# Patient Record
Sex: Male | Born: 1945
Health system: Southern US, Community
[De-identification: ages and names within clinical notes are randomized; demographics above are authoritative.]

## PROBLEM LIST (undated history)

## (undated) DIAGNOSIS — I2510 Atherosclerotic heart disease of native coronary artery without angina pectoris: Secondary | ICD-10-CM

## (undated) DIAGNOSIS — Z940 Kidney transplant status: Secondary | ICD-10-CM

## (undated) DIAGNOSIS — Z9489 Other transplanted organ and tissue status: Secondary | ICD-10-CM

## (undated) DIAGNOSIS — G629 Polyneuropathy, unspecified: Secondary | ICD-10-CM

## (undated) DIAGNOSIS — I456 Pre-excitation syndrome: Secondary | ICD-10-CM

## (undated) HISTORY — PX: CARDIAC CATHETERIZATION: SHX172

## (undated) HISTORY — PX: EYE SURGERY: SHX253

## (undated) HISTORY — PX: KIDNEY TRANSPLANT: SHX239

## (undated) HISTORY — PX: COMBINED KIDNEY-PANCREAS TRANSPLANT: SHX1382

---

## 2016-03-03 DIAGNOSIS — Z885 Allergy status to narcotic agent status: Secondary | ICD-10-CM | POA: Diagnosis not present

## 2016-03-03 DIAGNOSIS — E119 Type 2 diabetes mellitus without complications: Secondary | ICD-10-CM | POA: Diagnosis not present

## 2016-03-03 DIAGNOSIS — R0602 Shortness of breath: Secondary | ICD-10-CM | POA: Diagnosis not present

## 2016-03-03 DIAGNOSIS — Z87891 Personal history of nicotine dependence: Secondary | ICD-10-CM | POA: Diagnosis not present

## 2016-03-03 DIAGNOSIS — R202 Paresthesia of skin: Secondary | ICD-10-CM | POA: Diagnosis not present

## 2016-03-03 DIAGNOSIS — I2510 Atherosclerotic heart disease of native coronary artery without angina pectoris: Secondary | ICD-10-CM | POA: Diagnosis not present

## 2016-03-03 DIAGNOSIS — I456 Pre-excitation syndrome: Secondary | ICD-10-CM | POA: Diagnosis not present

## 2016-03-03 DIAGNOSIS — Z79899 Other long term (current) drug therapy: Secondary | ICD-10-CM | POA: Diagnosis not present

## 2016-03-03 DIAGNOSIS — Z791 Long term (current) use of non-steroidal anti-inflammatories (NSAID): Secondary | ICD-10-CM | POA: Diagnosis not present

## 2016-03-03 DIAGNOSIS — Z7982 Long term (current) use of aspirin: Secondary | ICD-10-CM | POA: Diagnosis not present

## 2016-03-03 DIAGNOSIS — R0789 Other chest pain: Secondary | ICD-10-CM | POA: Diagnosis not present

## 2016-03-03 DIAGNOSIS — R079 Chest pain, unspecified: Secondary | ICD-10-CM | POA: Diagnosis not present

## 2016-03-03 DIAGNOSIS — Z8673 Personal history of transient ischemic attack (TIA), and cerebral infarction without residual deficits: Secondary | ICD-10-CM | POA: Diagnosis not present

## 2016-03-03 DIAGNOSIS — Z91018 Allergy to other foods: Secondary | ICD-10-CM | POA: Diagnosis not present

## 2016-03-03 DIAGNOSIS — I10 Essential (primary) hypertension: Secondary | ICD-10-CM | POA: Diagnosis not present

## 2016-04-06 DIAGNOSIS — Z91018 Allergy to other foods: Secondary | ICD-10-CM | POA: Diagnosis not present

## 2016-04-06 DIAGNOSIS — R9431 Abnormal electrocardiogram [ECG] [EKG]: Secondary | ICD-10-CM | POA: Diagnosis not present

## 2016-04-06 DIAGNOSIS — R42 Dizziness and giddiness: Secondary | ICD-10-CM | POA: Diagnosis not present

## 2016-04-06 DIAGNOSIS — Z791 Long term (current) use of non-steroidal anti-inflammatories (NSAID): Secondary | ICD-10-CM | POA: Diagnosis not present

## 2016-04-06 DIAGNOSIS — Z87891 Personal history of nicotine dependence: Secondary | ICD-10-CM | POA: Diagnosis not present

## 2016-04-06 DIAGNOSIS — R001 Bradycardia, unspecified: Secondary | ICD-10-CM | POA: Diagnosis not present

## 2016-04-06 DIAGNOSIS — Z885 Allergy status to narcotic agent status: Secondary | ICD-10-CM | POA: Diagnosis not present

## 2016-04-06 DIAGNOSIS — Z8673 Personal history of transient ischemic attack (TIA), and cerebral infarction without residual deficits: Secondary | ICD-10-CM | POA: Diagnosis not present

## 2016-04-06 DIAGNOSIS — Z79899 Other long term (current) drug therapy: Secondary | ICD-10-CM | POA: Diagnosis not present

## 2016-04-06 DIAGNOSIS — R079 Chest pain, unspecified: Secondary | ICD-10-CM | POA: Diagnosis not present

## 2016-04-06 DIAGNOSIS — R531 Weakness: Secondary | ICD-10-CM | POA: Diagnosis not present

## 2016-04-06 DIAGNOSIS — I10 Essential (primary) hypertension: Secondary | ICD-10-CM | POA: Diagnosis not present

## 2016-05-03 DIAGNOSIS — I639 Cerebral infarction, unspecified: Secondary | ICD-10-CM | POA: Diagnosis not present

## 2016-05-03 DIAGNOSIS — I2510 Atherosclerotic heart disease of native coronary artery without angina pectoris: Secondary | ICD-10-CM | POA: Diagnosis not present

## 2016-05-03 DIAGNOSIS — E119 Type 2 diabetes mellitus without complications: Secondary | ICD-10-CM | POA: Diagnosis not present

## 2016-05-03 DIAGNOSIS — R51 Headache: Secondary | ICD-10-CM | POA: Diagnosis not present

## 2016-05-03 DIAGNOSIS — R9431 Abnormal electrocardiogram [ECG] [EKG]: Secondary | ICD-10-CM | POA: Diagnosis not present

## 2016-05-03 DIAGNOSIS — I10 Essential (primary) hypertension: Secondary | ICD-10-CM | POA: Diagnosis not present

## 2016-05-03 DIAGNOSIS — R202 Paresthesia of skin: Secondary | ICD-10-CM | POA: Diagnosis not present

## 2016-05-03 DIAGNOSIS — M25512 Pain in left shoulder: Secondary | ICD-10-CM | POA: Diagnosis not present

## 2016-05-03 DIAGNOSIS — E11618 Type 2 diabetes mellitus with other diabetic arthropathy: Secondary | ICD-10-CM | POA: Diagnosis not present

## 2016-05-03 DIAGNOSIS — I471 Supraventricular tachycardia: Secondary | ICD-10-CM | POA: Diagnosis not present

## 2016-05-03 DIAGNOSIS — R479 Unspecified speech disturbances: Secondary | ICD-10-CM | POA: Diagnosis not present

## 2016-05-03 DIAGNOSIS — R079 Chest pain, unspecified: Secondary | ICD-10-CM | POA: Diagnosis not present

## 2016-05-03 DIAGNOSIS — Z87891 Personal history of nicotine dependence: Secondary | ICD-10-CM | POA: Diagnosis not present

## 2016-05-03 DIAGNOSIS — Z91018 Allergy to other foods: Secondary | ICD-10-CM | POA: Diagnosis not present

## 2016-05-03 DIAGNOSIS — Z951 Presence of aortocoronary bypass graft: Secondary | ICD-10-CM | POA: Diagnosis not present

## 2016-05-15 DIAGNOSIS — M545 Low back pain: Secondary | ICD-10-CM | POA: Diagnosis not present

## 2016-05-15 DIAGNOSIS — R262 Difficulty in walking, not elsewhere classified: Secondary | ICD-10-CM | POA: Diagnosis not present

## 2016-05-15 DIAGNOSIS — M6281 Muscle weakness (generalized): Secondary | ICD-10-CM | POA: Diagnosis not present

## 2016-05-17 DIAGNOSIS — M6281 Muscle weakness (generalized): Secondary | ICD-10-CM | POA: Diagnosis not present

## 2016-05-17 DIAGNOSIS — M545 Low back pain: Secondary | ICD-10-CM | POA: Diagnosis not present

## 2016-05-17 DIAGNOSIS — R262 Difficulty in walking, not elsewhere classified: Secondary | ICD-10-CM | POA: Diagnosis not present

## 2016-05-23 DIAGNOSIS — M545 Low back pain: Secondary | ICD-10-CM | POA: Diagnosis not present

## 2016-05-23 DIAGNOSIS — R262 Difficulty in walking, not elsewhere classified: Secondary | ICD-10-CM | POA: Diagnosis not present

## 2016-05-23 DIAGNOSIS — M6281 Muscle weakness (generalized): Secondary | ICD-10-CM | POA: Diagnosis not present

## 2016-05-29 DIAGNOSIS — Z9483 Pancreas transplant status: Secondary | ICD-10-CM | POA: Diagnosis not present

## 2016-05-29 DIAGNOSIS — I456 Pre-excitation syndrome: Secondary | ICD-10-CM | POA: Diagnosis not present

## 2016-05-29 DIAGNOSIS — E876 Hypokalemia: Secondary | ICD-10-CM | POA: Diagnosis not present

## 2016-05-29 DIAGNOSIS — I2510 Atherosclerotic heart disease of native coronary artery without angina pectoris: Secondary | ICD-10-CM | POA: Diagnosis not present

## 2016-05-29 DIAGNOSIS — I4891 Unspecified atrial fibrillation: Secondary | ICD-10-CM | POA: Diagnosis not present

## 2016-05-29 DIAGNOSIS — E119 Type 2 diabetes mellitus without complications: Secondary | ICD-10-CM | POA: Diagnosis not present

## 2016-05-29 DIAGNOSIS — Z940 Kidney transplant status: Secondary | ICD-10-CM | POA: Diagnosis not present

## 2016-05-29 DIAGNOSIS — Z7901 Long term (current) use of anticoagulants: Secondary | ICD-10-CM | POA: Diagnosis not present

## 2016-05-29 DIAGNOSIS — K219 Gastro-esophageal reflux disease without esophagitis: Secondary | ICD-10-CM | POA: Diagnosis not present

## 2016-05-29 DIAGNOSIS — G43109 Migraine with aura, not intractable, without status migrainosus: Secondary | ICD-10-CM | POA: Diagnosis not present

## 2016-05-29 DIAGNOSIS — E785 Hyperlipidemia, unspecified: Secondary | ICD-10-CM | POA: Diagnosis not present

## 2016-05-29 DIAGNOSIS — R0600 Dyspnea, unspecified: Secondary | ICD-10-CM | POA: Diagnosis not present

## 2016-05-29 DIAGNOSIS — Z79899 Other long term (current) drug therapy: Secondary | ICD-10-CM | POA: Diagnosis not present

## 2016-05-30 DIAGNOSIS — I4891 Unspecified atrial fibrillation: Secondary | ICD-10-CM | POA: Diagnosis not present

## 2016-05-30 DIAGNOSIS — R079 Chest pain, unspecified: Secondary | ICD-10-CM | POA: Diagnosis not present

## 2016-05-30 DIAGNOSIS — D72829 Elevated white blood cell count, unspecified: Secondary | ICD-10-CM | POA: Diagnosis not present

## 2016-05-31 DIAGNOSIS — R079 Chest pain, unspecified: Secondary | ICD-10-CM | POA: Diagnosis not present

## 2016-05-31 DIAGNOSIS — D72829 Elevated white blood cell count, unspecified: Secondary | ICD-10-CM | POA: Diagnosis not present

## 2016-05-31 DIAGNOSIS — I4891 Unspecified atrial fibrillation: Secondary | ICD-10-CM | POA: Diagnosis not present

## 2016-06-05 DIAGNOSIS — M6281 Muscle weakness (generalized): Secondary | ICD-10-CM | POA: Diagnosis not present

## 2016-06-05 DIAGNOSIS — E119 Type 2 diabetes mellitus without complications: Secondary | ICD-10-CM | POA: Diagnosis not present

## 2016-06-05 DIAGNOSIS — Z8673 Personal history of transient ischemic attack (TIA), and cerebral infarction without residual deficits: Secondary | ICD-10-CM | POA: Diagnosis not present

## 2016-06-05 DIAGNOSIS — Z91018 Allergy to other foods: Secondary | ICD-10-CM | POA: Diagnosis not present

## 2016-06-05 DIAGNOSIS — R0602 Shortness of breath: Secondary | ICD-10-CM | POA: Diagnosis not present

## 2016-06-05 DIAGNOSIS — I10 Essential (primary) hypertension: Secondary | ICD-10-CM | POA: Diagnosis not present

## 2016-06-05 DIAGNOSIS — D72829 Elevated white blood cell count, unspecified: Secondary | ICD-10-CM | POA: Diagnosis not present

## 2016-06-05 DIAGNOSIS — Z7982 Long term (current) use of aspirin: Secondary | ICD-10-CM | POA: Diagnosis not present

## 2016-06-05 DIAGNOSIS — M549 Dorsalgia, unspecified: Secondary | ICD-10-CM | POA: Diagnosis not present

## 2016-06-05 DIAGNOSIS — R262 Difficulty in walking, not elsewhere classified: Secondary | ICD-10-CM | POA: Diagnosis not present

## 2016-06-05 DIAGNOSIS — Z87891 Personal history of nicotine dependence: Secondary | ICD-10-CM | POA: Diagnosis not present

## 2016-06-05 DIAGNOSIS — R079 Chest pain, unspecified: Secondary | ICD-10-CM | POA: Diagnosis not present

## 2016-06-05 DIAGNOSIS — Z940 Kidney transplant status: Secondary | ICD-10-CM | POA: Diagnosis not present

## 2016-06-05 DIAGNOSIS — R9431 Abnormal electrocardiogram [ECG] [EKG]: Secondary | ICD-10-CM | POA: Diagnosis not present

## 2016-06-05 DIAGNOSIS — I4891 Unspecified atrial fibrillation: Secondary | ICD-10-CM | POA: Diagnosis not present

## 2016-06-05 DIAGNOSIS — R42 Dizziness and giddiness: Secondary | ICD-10-CM | POA: Diagnosis not present

## 2016-06-05 DIAGNOSIS — Z885 Allergy status to narcotic agent status: Secondary | ICD-10-CM | POA: Diagnosis not present

## 2016-06-05 DIAGNOSIS — Z9483 Pancreas transplant status: Secondary | ICD-10-CM | POA: Diagnosis not present

## 2016-06-05 DIAGNOSIS — R002 Palpitations: Secondary | ICD-10-CM | POA: Diagnosis not present

## 2016-06-05 DIAGNOSIS — Z7951 Long term (current) use of inhaled steroids: Secondary | ICD-10-CM | POA: Diagnosis not present

## 2016-06-05 DIAGNOSIS — M545 Low back pain: Secondary | ICD-10-CM | POA: Diagnosis not present

## 2016-06-06 DIAGNOSIS — Z91018 Allergy to other foods: Secondary | ICD-10-CM | POA: Diagnosis not present

## 2016-06-06 DIAGNOSIS — Z7951 Long term (current) use of inhaled steroids: Secondary | ICD-10-CM | POA: Diagnosis not present

## 2016-06-06 DIAGNOSIS — R002 Palpitations: Secondary | ICD-10-CM | POA: Diagnosis not present

## 2016-06-06 DIAGNOSIS — Z8673 Personal history of transient ischemic attack (TIA), and cerebral infarction without residual deficits: Secondary | ICD-10-CM | POA: Diagnosis not present

## 2016-06-06 DIAGNOSIS — Z7982 Long term (current) use of aspirin: Secondary | ICD-10-CM | POA: Diagnosis not present

## 2016-06-06 DIAGNOSIS — Z885 Allergy status to narcotic agent status: Secondary | ICD-10-CM | POA: Diagnosis not present

## 2016-06-06 DIAGNOSIS — Z87891 Personal history of nicotine dependence: Secondary | ICD-10-CM | POA: Diagnosis not present

## 2016-06-06 DIAGNOSIS — R9431 Abnormal electrocardiogram [ECG] [EKG]: Secondary | ICD-10-CM | POA: Diagnosis not present

## 2016-06-06 DIAGNOSIS — I10 Essential (primary) hypertension: Secondary | ICD-10-CM | POA: Diagnosis not present

## 2016-06-06 DIAGNOSIS — E119 Type 2 diabetes mellitus without complications: Secondary | ICD-10-CM | POA: Diagnosis not present

## 2016-06-08 DIAGNOSIS — R262 Difficulty in walking, not elsewhere classified: Secondary | ICD-10-CM | POA: Diagnosis not present

## 2016-06-08 DIAGNOSIS — M545 Low back pain: Secondary | ICD-10-CM | POA: Diagnosis not present

## 2016-06-08 DIAGNOSIS — M6281 Muscle weakness (generalized): Secondary | ICD-10-CM | POA: Diagnosis not present

## 2016-06-13 DIAGNOSIS — M545 Low back pain: Secondary | ICD-10-CM | POA: Diagnosis not present

## 2016-06-13 DIAGNOSIS — R262 Difficulty in walking, not elsewhere classified: Secondary | ICD-10-CM | POA: Diagnosis not present

## 2016-06-13 DIAGNOSIS — M6281 Muscle weakness (generalized): Secondary | ICD-10-CM | POA: Diagnosis not present

## 2016-06-15 DIAGNOSIS — M545 Low back pain: Secondary | ICD-10-CM | POA: Diagnosis not present

## 2016-06-15 DIAGNOSIS — M6281 Muscle weakness (generalized): Secondary | ICD-10-CM | POA: Diagnosis not present

## 2016-06-15 DIAGNOSIS — R262 Difficulty in walking, not elsewhere classified: Secondary | ICD-10-CM | POA: Diagnosis not present

## 2016-06-26 DIAGNOSIS — M545 Low back pain: Secondary | ICD-10-CM | POA: Diagnosis not present

## 2016-06-26 DIAGNOSIS — R262 Difficulty in walking, not elsewhere classified: Secondary | ICD-10-CM | POA: Diagnosis not present

## 2016-06-26 DIAGNOSIS — M6281 Muscle weakness (generalized): Secondary | ICD-10-CM | POA: Diagnosis not present

## 2016-06-28 DIAGNOSIS — M6281 Muscle weakness (generalized): Secondary | ICD-10-CM | POA: Diagnosis not present

## 2016-06-28 DIAGNOSIS — M545 Low back pain: Secondary | ICD-10-CM | POA: Diagnosis not present

## 2016-06-28 DIAGNOSIS — R262 Difficulty in walking, not elsewhere classified: Secondary | ICD-10-CM | POA: Diagnosis not present

## 2016-07-05 DIAGNOSIS — R262 Difficulty in walking, not elsewhere classified: Secondary | ICD-10-CM | POA: Diagnosis not present

## 2016-07-05 DIAGNOSIS — M6281 Muscle weakness (generalized): Secondary | ICD-10-CM | POA: Diagnosis not present

## 2016-07-05 DIAGNOSIS — M545 Low back pain: Secondary | ICD-10-CM | POA: Diagnosis not present

## 2016-07-12 DIAGNOSIS — M545 Low back pain: Secondary | ICD-10-CM | POA: Diagnosis not present

## 2016-07-12 DIAGNOSIS — M6281 Muscle weakness (generalized): Secondary | ICD-10-CM | POA: Diagnosis not present

## 2016-07-12 DIAGNOSIS — R262 Difficulty in walking, not elsewhere classified: Secondary | ICD-10-CM | POA: Diagnosis not present

## 2016-07-15 DIAGNOSIS — R079 Chest pain, unspecified: Secondary | ICD-10-CM | POA: Diagnosis not present

## 2016-07-15 DIAGNOSIS — R911 Solitary pulmonary nodule: Secondary | ICD-10-CM | POA: Diagnosis not present

## 2016-07-15 DIAGNOSIS — Z853 Personal history of malignant neoplasm of breast: Secondary | ICD-10-CM | POA: Diagnosis not present

## 2016-07-15 DIAGNOSIS — R918 Other nonspecific abnormal finding of lung field: Secondary | ICD-10-CM | POA: Diagnosis not present

## 2016-07-15 DIAGNOSIS — I2510 Atherosclerotic heart disease of native coronary artery without angina pectoris: Secondary | ICD-10-CM | POA: Diagnosis not present

## 2016-07-15 DIAGNOSIS — Z8673 Personal history of transient ischemic attack (TIA), and cerebral infarction without residual deficits: Secondary | ICD-10-CM | POA: Diagnosis not present

## 2016-07-15 DIAGNOSIS — Z9483 Pancreas transplant status: Secondary | ICD-10-CM | POA: Diagnosis not present

## 2016-07-15 DIAGNOSIS — R0789 Other chest pain: Secondary | ICD-10-CM | POA: Diagnosis not present

## 2016-07-15 DIAGNOSIS — E119 Type 2 diabetes mellitus without complications: Secondary | ICD-10-CM | POA: Diagnosis not present

## 2016-07-15 DIAGNOSIS — K3184 Gastroparesis: Secondary | ICD-10-CM | POA: Diagnosis not present

## 2016-07-15 DIAGNOSIS — I10 Essential (primary) hypertension: Secondary | ICD-10-CM | POA: Diagnosis not present

## 2016-07-15 DIAGNOSIS — E1143 Type 2 diabetes mellitus with diabetic autonomic (poly)neuropathy: Secondary | ICD-10-CM | POA: Diagnosis not present

## 2016-07-15 DIAGNOSIS — Z940 Kidney transplant status: Secondary | ICD-10-CM | POA: Diagnosis not present

## 2016-07-15 DIAGNOSIS — M79602 Pain in left arm: Secondary | ICD-10-CM | POA: Diagnosis not present

## 2016-07-15 DIAGNOSIS — N261 Atrophy of kidney (terminal): Secondary | ICD-10-CM | POA: Diagnosis not present

## 2016-07-15 DIAGNOSIS — J9811 Atelectasis: Secondary | ICD-10-CM | POA: Diagnosis not present

## 2016-08-13 DIAGNOSIS — C080 Malignant neoplasm of submandibular gland: Secondary | ICD-10-CM | POA: Diagnosis not present

## 2016-08-13 DIAGNOSIS — R079 Chest pain, unspecified: Secondary | ICD-10-CM | POA: Diagnosis not present

## 2016-08-13 DIAGNOSIS — T673XXA Heat exhaustion, anhydrotic, initial encounter: Secondary | ICD-10-CM | POA: Diagnosis not present

## 2016-08-13 DIAGNOSIS — R9431 Abnormal electrocardiogram [ECG] [EKG]: Secondary | ICD-10-CM | POA: Diagnosis not present

## 2016-08-13 DIAGNOSIS — I10 Essential (primary) hypertension: Secondary | ICD-10-CM | POA: Diagnosis not present

## 2016-08-13 DIAGNOSIS — Z885 Allergy status to narcotic agent status: Secondary | ICD-10-CM | POA: Diagnosis not present

## 2016-08-13 DIAGNOSIS — Z91018 Allergy to other foods: Secondary | ICD-10-CM | POA: Diagnosis not present

## 2016-08-13 DIAGNOSIS — J9811 Atelectasis: Secondary | ICD-10-CM | POA: Diagnosis not present

## 2016-08-13 DIAGNOSIS — Z87891 Personal history of nicotine dependence: Secondary | ICD-10-CM | POA: Diagnosis not present

## 2016-08-13 DIAGNOSIS — R404 Transient alteration of awareness: Secondary | ICD-10-CM | POA: Diagnosis not present

## 2016-08-13 DIAGNOSIS — E119 Type 2 diabetes mellitus without complications: Secondary | ICD-10-CM | POA: Diagnosis not present

## 2016-08-13 DIAGNOSIS — Z7982 Long term (current) use of aspirin: Secondary | ICD-10-CM | POA: Diagnosis not present

## 2016-08-13 DIAGNOSIS — Z8719 Personal history of other diseases of the digestive system: Secondary | ICD-10-CM | POA: Diagnosis not present

## 2016-08-13 DIAGNOSIS — T675XXA Heat exhaustion, unspecified, initial encounter: Secondary | ICD-10-CM | POA: Diagnosis not present

## 2016-08-13 DIAGNOSIS — X58XXXA Exposure to other specified factors, initial encounter: Secondary | ICD-10-CM | POA: Diagnosis not present

## 2016-08-13 DIAGNOSIS — R531 Weakness: Secondary | ICD-10-CM | POA: Diagnosis not present

## 2016-09-13 DIAGNOSIS — K3184 Gastroparesis: Secondary | ICD-10-CM | POA: Diagnosis not present

## 2016-09-13 DIAGNOSIS — M47892 Other spondylosis, cervical region: Secondary | ICD-10-CM | POA: Diagnosis not present

## 2016-09-13 DIAGNOSIS — R42 Dizziness and giddiness: Secondary | ICD-10-CM | POA: Diagnosis not present

## 2016-09-13 DIAGNOSIS — Z9483 Pancreas transplant status: Secondary | ICD-10-CM | POA: Diagnosis not present

## 2016-09-13 DIAGNOSIS — Z041 Encounter for examination and observation following transport accident: Secondary | ICD-10-CM | POA: Diagnosis not present

## 2016-09-13 DIAGNOSIS — R079 Chest pain, unspecified: Secondary | ICD-10-CM | POA: Diagnosis not present

## 2016-09-13 DIAGNOSIS — M47812 Spondylosis without myelopathy or radiculopathy, cervical region: Secondary | ICD-10-CM | POA: Diagnosis not present

## 2016-09-13 DIAGNOSIS — Z940 Kidney transplant status: Secondary | ICD-10-CM | POA: Diagnosis not present

## 2016-09-13 DIAGNOSIS — R5383 Other fatigue: Secondary | ICD-10-CM | POA: Diagnosis not present

## 2016-09-13 DIAGNOSIS — G319 Degenerative disease of nervous system, unspecified: Secondary | ICD-10-CM | POA: Diagnosis not present

## 2016-09-13 DIAGNOSIS — S0001XA Abrasion of scalp, initial encounter: Secondary | ICD-10-CM | POA: Diagnosis not present

## 2016-09-13 DIAGNOSIS — R531 Weakness: Secondary | ICD-10-CM | POA: Diagnosis not present

## 2016-09-13 DIAGNOSIS — R9431 Abnormal electrocardiogram [ECG] [EKG]: Secondary | ICD-10-CM | POA: Diagnosis not present

## 2016-09-13 DIAGNOSIS — J9811 Atelectasis: Secondary | ICD-10-CM | POA: Diagnosis not present

## 2016-09-13 DIAGNOSIS — M5030 Other cervical disc degeneration, unspecified cervical region: Secondary | ICD-10-CM | POA: Diagnosis not present

## 2016-09-13 DIAGNOSIS — R001 Bradycardia, unspecified: Secondary | ICD-10-CM | POA: Diagnosis not present

## 2016-09-13 DIAGNOSIS — Z8673 Personal history of transient ischemic attack (TIA), and cerebral infarction without residual deficits: Secondary | ICD-10-CM | POA: Diagnosis not present

## 2016-10-31 DIAGNOSIS — Z885 Allergy status to narcotic agent status: Secondary | ICD-10-CM | POA: Diagnosis not present

## 2016-10-31 DIAGNOSIS — Z87891 Personal history of nicotine dependence: Secondary | ICD-10-CM | POA: Diagnosis not present

## 2016-10-31 DIAGNOSIS — R930 Abnormal findings on diagnostic imaging of skull and head, not elsewhere classified: Secondary | ICD-10-CM | POA: Diagnosis not present

## 2016-10-31 DIAGNOSIS — S0083XA Contusion of other part of head, initial encounter: Secondary | ICD-10-CM | POA: Diagnosis not present

## 2016-10-31 DIAGNOSIS — R42 Dizziness and giddiness: Secondary | ICD-10-CM | POA: Diagnosis not present

## 2016-10-31 DIAGNOSIS — M542 Cervicalgia: Secondary | ICD-10-CM | POA: Diagnosis not present

## 2016-10-31 DIAGNOSIS — M5030 Other cervical disc degeneration, unspecified cervical region: Secondary | ICD-10-CM | POA: Diagnosis not present

## 2016-10-31 DIAGNOSIS — I69328 Other speech and language deficits following cerebral infarction: Secondary | ICD-10-CM | POA: Diagnosis not present

## 2016-10-31 DIAGNOSIS — Z91018 Allergy to other foods: Secondary | ICD-10-CM | POA: Diagnosis not present

## 2016-10-31 DIAGNOSIS — Z940 Kidney transplant status: Secondary | ICD-10-CM | POA: Diagnosis not present

## 2016-10-31 DIAGNOSIS — M47812 Spondylosis without myelopathy or radiculopathy, cervical region: Secondary | ICD-10-CM | POA: Diagnosis not present

## 2016-10-31 DIAGNOSIS — S0990XA Unspecified injury of head, initial encounter: Secondary | ICD-10-CM | POA: Diagnosis not present

## 2016-11-03 DIAGNOSIS — Z9483 Pancreas transplant status: Secondary | ICD-10-CM | POA: Diagnosis not present

## 2016-11-03 DIAGNOSIS — Z885 Allergy status to narcotic agent status: Secondary | ICD-10-CM | POA: Diagnosis not present

## 2016-11-03 DIAGNOSIS — I471 Supraventricular tachycardia: Secondary | ICD-10-CM | POA: Diagnosis not present

## 2016-11-03 DIAGNOSIS — E162 Hypoglycemia, unspecified: Secondary | ICD-10-CM | POA: Diagnosis not present

## 2016-11-03 DIAGNOSIS — E119 Type 2 diabetes mellitus without complications: Secondary | ICD-10-CM | POA: Diagnosis not present

## 2016-11-03 DIAGNOSIS — I2510 Atherosclerotic heart disease of native coronary artery without angina pectoris: Secondary | ICD-10-CM | POA: Diagnosis not present

## 2016-11-03 DIAGNOSIS — R918 Other nonspecific abnormal finding of lung field: Secondary | ICD-10-CM | POA: Diagnosis not present

## 2016-11-03 DIAGNOSIS — R42 Dizziness and giddiness: Secondary | ICD-10-CM | POA: Diagnosis not present

## 2016-11-03 DIAGNOSIS — Z888 Allergy status to other drugs, medicaments and biological substances status: Secondary | ICD-10-CM | POA: Diagnosis not present

## 2016-11-03 DIAGNOSIS — I10 Essential (primary) hypertension: Secondary | ICD-10-CM | POA: Diagnosis not present

## 2016-11-03 DIAGNOSIS — E11641 Type 2 diabetes mellitus with hypoglycemia with coma: Secondary | ICD-10-CM | POA: Diagnosis not present

## 2016-11-03 DIAGNOSIS — J984 Other disorders of lung: Secondary | ICD-10-CM | POA: Diagnosis not present

## 2016-11-03 DIAGNOSIS — R079 Chest pain, unspecified: Secondary | ICD-10-CM | POA: Diagnosis not present

## 2016-11-03 DIAGNOSIS — Z940 Kidney transplant status: Secondary | ICD-10-CM | POA: Diagnosis not present

## 2016-11-03 DIAGNOSIS — I480 Paroxysmal atrial fibrillation: Secondary | ICD-10-CM | POA: Diagnosis not present

## 2016-11-03 DIAGNOSIS — D72829 Elevated white blood cell count, unspecified: Secondary | ICD-10-CM | POA: Diagnosis not present

## 2016-11-04 DIAGNOSIS — E162 Hypoglycemia, unspecified: Secondary | ICD-10-CM | POA: Diagnosis not present

## 2016-11-04 DIAGNOSIS — R079 Chest pain, unspecified: Secondary | ICD-10-CM | POA: Diagnosis not present

## 2016-11-04 DIAGNOSIS — Z940 Kidney transplant status: Secondary | ICD-10-CM | POA: Diagnosis not present

## 2016-11-04 DIAGNOSIS — Z79899 Other long term (current) drug therapy: Secondary | ICD-10-CM | POA: Diagnosis not present

## 2016-11-05 DIAGNOSIS — R079 Chest pain, unspecified: Secondary | ICD-10-CM | POA: Diagnosis not present

## 2016-11-05 DIAGNOSIS — Z79899 Other long term (current) drug therapy: Secondary | ICD-10-CM | POA: Diagnosis not present

## 2016-11-05 DIAGNOSIS — E162 Hypoglycemia, unspecified: Secondary | ICD-10-CM | POA: Diagnosis not present

## 2016-11-05 DIAGNOSIS — Z940 Kidney transplant status: Secondary | ICD-10-CM | POA: Diagnosis not present

## 2017-02-02 DIAGNOSIS — S060X9A Concussion with loss of consciousness of unspecified duration, initial encounter: Secondary | ICD-10-CM | POA: Diagnosis not present

## 2017-02-02 DIAGNOSIS — S060X0A Concussion without loss of consciousness, initial encounter: Secondary | ICD-10-CM | POA: Diagnosis not present

## 2017-02-02 DIAGNOSIS — Z87891 Personal history of nicotine dependence: Secondary | ICD-10-CM | POA: Diagnosis not present

## 2017-02-02 DIAGNOSIS — W1830XA Fall on same level, unspecified, initial encounter: Secondary | ICD-10-CM | POA: Diagnosis not present

## 2017-02-02 DIAGNOSIS — T1490XA Injury, unspecified, initial encounter: Secondary | ICD-10-CM | POA: Diagnosis not present

## 2017-02-02 DIAGNOSIS — Z9483 Pancreas transplant status: Secondary | ICD-10-CM | POA: Diagnosis not present

## 2017-02-02 DIAGNOSIS — S0990XA Unspecified injury of head, initial encounter: Secondary | ICD-10-CM | POA: Diagnosis not present

## 2017-02-02 DIAGNOSIS — Z993 Dependence on wheelchair: Secondary | ICD-10-CM | POA: Diagnosis not present

## 2017-02-02 DIAGNOSIS — Z940 Kidney transplant status: Secondary | ICD-10-CM | POA: Diagnosis not present

## 2017-02-02 DIAGNOSIS — E1140 Type 2 diabetes mellitus with diabetic neuropathy, unspecified: Secondary | ICD-10-CM | POA: Diagnosis not present

## 2017-02-02 DIAGNOSIS — R531 Weakness: Secondary | ICD-10-CM | POA: Diagnosis not present

## 2017-02-06 DIAGNOSIS — Z7951 Long term (current) use of inhaled steroids: Secondary | ICD-10-CM | POA: Diagnosis not present

## 2017-02-06 DIAGNOSIS — S60221A Contusion of right hand, initial encounter: Secondary | ICD-10-CM | POA: Diagnosis not present

## 2017-02-06 DIAGNOSIS — S0990XA Unspecified injury of head, initial encounter: Secondary | ICD-10-CM | POA: Diagnosis not present

## 2017-02-06 DIAGNOSIS — Z87891 Personal history of nicotine dependence: Secondary | ICD-10-CM | POA: Diagnosis not present

## 2017-02-06 DIAGNOSIS — M542 Cervicalgia: Secondary | ICD-10-CM | POA: Diagnosis not present

## 2017-02-06 DIAGNOSIS — Z885 Allergy status to narcotic agent status: Secondary | ICD-10-CM | POA: Diagnosis not present

## 2017-02-06 DIAGNOSIS — Z7982 Long term (current) use of aspirin: Secondary | ICD-10-CM | POA: Diagnosis not present

## 2017-02-06 DIAGNOSIS — W228XXA Striking against or struck by other objects, initial encounter: Secondary | ICD-10-CM | POA: Diagnosis not present

## 2017-02-06 DIAGNOSIS — R51 Headache: Secondary | ICD-10-CM | POA: Diagnosis not present

## 2017-02-06 DIAGNOSIS — S299XXA Unspecified injury of thorax, initial encounter: Secondary | ICD-10-CM | POA: Diagnosis not present

## 2017-02-06 DIAGNOSIS — E119 Type 2 diabetes mellitus without complications: Secondary | ICD-10-CM | POA: Diagnosis not present

## 2017-02-06 DIAGNOSIS — Z043 Encounter for examination and observation following other accident: Secondary | ICD-10-CM | POA: Diagnosis not present

## 2017-02-06 DIAGNOSIS — I10 Essential (primary) hypertension: Secondary | ICD-10-CM | POA: Diagnosis not present

## 2017-02-06 DIAGNOSIS — Z91018 Allergy to other foods: Secondary | ICD-10-CM | POA: Diagnosis not present

## 2017-04-16 ENCOUNTER — Observation Stay
Admission: EM | Admit: 2017-04-16 | Discharge: 2017-04-18 | Disposition: A | Discharge status: Home / Self Care | Payer: Medicare HMO | Attending: Internal Medicine | Admitting: Internal Medicine

## 2017-04-16 ENCOUNTER — Encounter: Payer: Self-pay | Admitting: Emergency Medicine

## 2017-04-16 ENCOUNTER — Other Ambulatory Visit: Payer: Self-pay

## 2017-04-16 ENCOUNTER — Emergency Department: Payer: Medicare HMO

## 2017-04-16 DIAGNOSIS — R079 Chest pain, unspecified: Secondary | ICD-10-CM | POA: Diagnosis present

## 2017-04-16 DIAGNOSIS — Z79899 Other long term (current) drug therapy: Secondary | ICD-10-CM | POA: Insufficient documentation

## 2017-04-16 DIAGNOSIS — Z9483 Pancreas transplant status: Secondary | ICD-10-CM | POA: Diagnosis not present

## 2017-04-16 DIAGNOSIS — Z8249 Family history of ischemic heart disease and other diseases of the circulatory system: Secondary | ICD-10-CM | POA: Insufficient documentation

## 2017-04-16 DIAGNOSIS — I25110 Atherosclerotic heart disease of native coronary artery with unstable angina pectoris: Secondary | ICD-10-CM | POA: Diagnosis not present

## 2017-04-16 DIAGNOSIS — E1042 Type 1 diabetes mellitus with diabetic polyneuropathy: Secondary | ICD-10-CM | POA: Insufficient documentation

## 2017-04-16 DIAGNOSIS — Z940 Kidney transplant status: Secondary | ICD-10-CM | POA: Diagnosis not present

## 2017-04-16 DIAGNOSIS — I10 Essential (primary) hypertension: Secondary | ICD-10-CM | POA: Insufficient documentation

## 2017-04-16 DIAGNOSIS — I700 Atherosclerosis of aorta: Secondary | ICD-10-CM | POA: Diagnosis not present

## 2017-04-16 DIAGNOSIS — Z85828 Personal history of other malignant neoplasm of skin: Secondary | ICD-10-CM | POA: Diagnosis not present

## 2017-04-16 DIAGNOSIS — I200 Unstable angina: Secondary | ICD-10-CM

## 2017-04-16 DIAGNOSIS — Z7952 Long term (current) use of systemic steroids: Secondary | ICD-10-CM | POA: Diagnosis not present

## 2017-04-16 DIAGNOSIS — R0602 Shortness of breath: Secondary | ICD-10-CM | POA: Diagnosis not present

## 2017-04-16 DIAGNOSIS — Z7982 Long term (current) use of aspirin: Secondary | ICD-10-CM | POA: Diagnosis not present

## 2017-04-16 DIAGNOSIS — Z993 Dependence on wheelchair: Secondary | ICD-10-CM | POA: Diagnosis not present

## 2017-04-16 DIAGNOSIS — I2510 Atherosclerotic heart disease of native coronary artery without angina pectoris: Secondary | ICD-10-CM | POA: Diagnosis not present

## 2017-04-16 DIAGNOSIS — G629 Polyneuropathy, unspecified: Secondary | ICD-10-CM | POA: Diagnosis not present

## 2017-04-16 DIAGNOSIS — Z87891 Personal history of nicotine dependence: Secondary | ICD-10-CM | POA: Insufficient documentation

## 2017-04-16 HISTORY — DX: Kidney transplant status: Z94.0

## 2017-04-16 HISTORY — DX: Other transplanted organ and tissue status: Z94.89

## 2017-04-16 HISTORY — DX: Polyneuropathy, unspecified: G62.9

## 2017-04-16 HISTORY — DX: Pre-excitation syndrome: I45.6

## 2017-04-16 HISTORY — DX: Atherosclerotic heart disease of native coronary artery without angina pectoris: I25.10

## 2017-04-16 LAB — CBC
HCT: 43 % (ref 40.0–52.0)
HEMOGLOBIN: 13.9 g/dL (ref 13.0–18.0)
MCH: 30.1 pg (ref 26.0–34.0)
MCHC: 32.4 g/dL (ref 32.0–36.0)
MCV: 93 fL (ref 80.0–100.0)
Platelets: 217 10*3/uL (ref 150–440)
RBC: 4.62 MIL/uL (ref 4.40–5.90)
RDW: 13.2 % (ref 11.5–14.5)
WBC: 13.4 10*3/uL — ABNORMAL HIGH (ref 3.8–10.6)

## 2017-04-16 LAB — BASIC METABOLIC PANEL
Anion gap: 7 (ref 5–15)
BUN: 21 mg/dL — ABNORMAL HIGH (ref 6–20)
CALCIUM: 9.6 mg/dL (ref 8.9–10.3)
CO2: 24 mmol/L (ref 22–32)
Chloride: 105 mmol/L (ref 101–111)
Creatinine, Ser: 0.85 mg/dL (ref 0.61–1.24)
GFR calc Af Amer: >60 mL/min (ref >60)
GFR calc non Af Amer: >60 mL/min (ref >60)
Glucose, Bld: 140 mg/dL — ABNORMAL HIGH (ref 65–99)
Potassium: 4.4 mmol/L (ref 3.5–5.1)
Sodium: 136 mmol/L (ref 135–145)

## 2017-04-16 LAB — TROPONIN I

## 2017-04-16 MED ORDER — ACETAMINOPHEN 325 MG PO TABS
650.0000 mg | ORAL_TABLET | Freq: Four times a day (QID) | ORAL | Status: DC | PRN
  Administered 2017-04-17: 650 mg via ORAL
  Filled 2017-04-16: qty 2

## 2017-04-16 MED ORDER — LISINOPRIL 5 MG PO TABS
2.5000 mg | ORAL_TABLET | Freq: Every day | ORAL | Status: DC
  Administered 2017-04-17 – 2017-04-18 (×2): 2.5 mg via ORAL
  Filled 2017-04-16: qty 1

## 2017-04-16 MED ORDER — MAGNESIUM OXIDE 400 (241.3 MG) MG PO TABS
400.0000 mg | ORAL_TABLET | Freq: Two times a day (BID) | ORAL | Status: DC
  Administered 2017-04-17 – 2017-04-18 (×4): 400 mg via ORAL
  Filled 2017-04-16 (×2): qty 1

## 2017-04-16 MED ORDER — NITROGLYCERIN 2 % TD OINT
0.5000 [in_us] | TOPICAL_OINTMENT | Freq: Four times a day (QID) | TRANSDERMAL | Status: DC
  Administered 2017-04-17 – 2017-04-18 (×6): 0.5 [in_us] via TOPICAL
  Filled 2017-04-16 (×6): qty 1

## 2017-04-16 MED ORDER — METOPROLOL TARTRATE 25 MG PO TABS
25.0000 mg | ORAL_TABLET | Freq: Two times a day (BID) | ORAL | Status: DC
  Administered 2017-04-17 – 2017-04-18 (×4): 25 mg via ORAL
  Filled 2017-04-16 (×2): qty 1

## 2017-04-16 MED ORDER — ASPIRIN 81 MG PO CHEW
324.0000 mg | CHEWABLE_TABLET | Freq: Once | ORAL | Status: AC
Start: 2017-04-16 — End: 2017-04-16
  Administered 2017-04-16: 324 mg via ORAL
  Filled 2017-04-16: qty 4

## 2017-04-16 MED ORDER — ENOXAPARIN SODIUM 40 MG/0.4ML ~~LOC~~ SOLN
40.0000 mg | SUBCUTANEOUS | Status: DC
  Administered 2017-04-17: 40 mg via SUBCUTANEOUS
  Filled 2017-04-16: qty 0.4

## 2017-04-16 MED ORDER — VITAMIN D 1000 UNITS PO TABS
1000.0000 [IU] | ORAL_TABLET | Freq: Every day | ORAL | Status: DC
  Administered 2017-04-17 – 2017-04-18 (×2): 1000 [IU] via ORAL
  Filled 2017-04-16: qty 1

## 2017-04-16 MED ORDER — HYDROMORPHONE HCL 1 MG/ML IJ SOLN: 1.0000 mg | Freq: Four times a day (QID) | INTRAMUSCULAR | Status: DC | PRN

## 2017-04-16 MED ORDER — ONDANSETRON HCL 4 MG PO TABS: 4.0000 mg | ORAL_TABLET | Freq: Four times a day (QID) | ORAL | Status: DC | PRN

## 2017-04-16 MED ORDER — CARBOXYMETHYLCELLULOSE SODIUM 1 % OP SOLN: 1.0000 [drp] | Freq: Three times a day (TID) | OPHTHALMIC | Status: DC

## 2017-04-16 MED ORDER — ACETAMINOPHEN 650 MG RE SUPP: 650.0000 mg | Freq: Four times a day (QID) | RECTAL | Status: DC | PRN

## 2017-04-16 MED ORDER — NITROGLYCERIN 0.4 MG SL SUBL
0.4000 mg | SUBLINGUAL_TABLET | SUBLINGUAL | Status: DC | PRN
  Filled 2017-04-16: qty 1

## 2017-04-16 MED ORDER — TACROLIMUS 1 MG PO CAPS
3.0000 mg | ORAL_CAPSULE | Freq: Two times a day (BID) | ORAL | Status: DC
  Administered 2017-04-16 – 2017-04-18 (×4): 3 mg via ORAL
  Filled 2017-04-16 (×5): qty 3

## 2017-04-16 MED ORDER — IBUPROFEN 400 MG PO TABS: 400.0000 mg | ORAL_TABLET | Freq: Four times a day (QID) | ORAL | Status: DC | PRN

## 2017-04-16 MED ORDER — ATORVASTATIN CALCIUM 20 MG PO TABS
40.0000 mg | ORAL_TABLET | Freq: Every day | ORAL | Status: DC
  Administered 2017-04-17 – 2017-04-18 (×2): 40 mg via ORAL
  Filled 2017-04-16: qty 2

## 2017-04-16 MED ORDER — DULOXETINE HCL 30 MG PO CPEP
30.0000 mg | ORAL_CAPSULE | Freq: Every day | ORAL | Status: DC
  Administered 2017-04-17 – 2017-04-18 (×2): 30 mg via ORAL
  Filled 2017-04-16: qty 1

## 2017-04-16 MED ORDER — PREDNISONE 2.5 MG PO TABS
7.5000 mg | ORAL_TABLET | Freq: Every day | ORAL | Status: DC
  Administered 2017-04-17 – 2017-04-18 (×2): 7.5 mg via ORAL
  Filled 2017-04-16 (×2): qty 1

## 2017-04-16 MED ORDER — SODIUM CHLORIDE 0.9 % IV BOLUS (SEPSIS)
500.0000 mL | Freq: Once | INTRAVENOUS | Status: AC
  Administered 2017-04-16: 500 mL via INTRAVENOUS

## 2017-04-16 MED ORDER — ASPIRIN 81 MG PO CHEW
81.0000 mg | CHEWABLE_TABLET | Freq: Every day | ORAL | Status: DC
  Administered 2017-04-17 – 2017-04-18 (×2): 81 mg via ORAL
  Filled 2017-04-16: qty 1

## 2017-04-16 MED ORDER — MYCOPHENOLATE MOFETIL 250 MG PO CAPS
250.0000 mg | ORAL_CAPSULE | Freq: Two times a day (BID) | ORAL | Status: DC
  Administered 2017-04-16 – 2017-04-18 (×4): 250 mg via ORAL
  Filled 2017-04-16 (×5): qty 1

## 2017-04-16 MED ORDER — ONDANSETRON HCL 4 MG/2ML IJ SOLN: 4.0000 mg | Freq: Four times a day (QID) | INTRAMUSCULAR | Status: DC | PRN

## 2017-04-16 MED ORDER — OCUVITE-LUTEIN PO CAPS
1.0000 | ORAL_CAPSULE | Freq: Two times a day (BID) | ORAL | Status: DC
  Administered 2017-04-17 – 2017-04-18 (×4): 1 via ORAL
  Filled 2017-04-16 (×5): qty 1

## 2017-04-16 MED ORDER — ARTIFICIAL TEARS OPHTHALMIC OINT
TOPICAL_OINTMENT | Freq: Three times a day (TID) | OPHTHALMIC | Status: DC
  Administered 2017-04-17: 01:00:00 via OPHTHALMIC
  Filled 2017-04-16: qty 3.5

## 2017-04-16 NOTE — H&P (Signed)
[facility] NAME: Jonathon Cruz    MR#:  [id number]  DATE OF BIRTH:  January 30, 1946  DATE OF ADMISSION:  04/16/2017  PRIMARY CARE PHYSICIAN: Glade Stanford, MD   REQUESTING/REFERRING PHYSICIAN: Dr. Rudene Re  CHIEF COMPLAINT:   Chief Complaint  Patient presents with  . Chest Pain    HISTORY OF PRESENT ILLNESS:  Jonathon Cruz  is a 72 y.o. male with a known history of CAD with prior 45% blockage on cardiac catheterization, history of diabetes mellitus status post pancreatic islet cell transplantation, peripheral neuropathy with wheelchair-bound status, history of CKD stage V on dialysis now status post renal transplant presents to hospital secondary to worsening chest pain. Prior cardiac catheterization in 2016 revealed a 40-45% blockage in one of his coronary arteries. Medical management was recommended. Patient hasn't had chest pain up until 2 days ago when he woke up with intense pain in his chest radiating down his left arm and his shoulder. Associated with dyspnea and also nausea. The pain relieved with some nitroglycerin. Hasn't had more pain up until this morning when he was riding on his way to see his cardiologist at [facility] and he had intense chest pain that brought him to the hospital.  PAST MEDICAL HISTORY:   Past Medical History:  Diagnosis Date  . CAD (coronary artery disease)   . History of pancreatic islet cell transplantation   . Neuropathy   . Renal transplant recipient   . WPW (Wolff-Parkinson-White syndrome)     PAST SURGICAL HISTORY:   Past Surgical History:  Procedure Laterality Date  . COMBINED KIDNEY-PANCREAS TRANSPLANT    . EYE SURGERY    . KIDNEY TRANSPLANT      SOCIAL HISTORY:   Social History   Tobacco Use  . Smoking status: Former Research scientist (life sciences)  . Smokeless tobacco: Never Used  Substance Use Topics  . Alcohol use: Yes    Comment: occasional wine    FAMILY HISTORY:   Family History   Problem Relation Age of Onset  . CAD Mother   . Dementia Mother   . CAD Paternal Grandfather     DRUG ALLERGIES:   Allergies  Allergen Reactions  . Codeine Nausea And Vomiting    Last for days  . Grapefruit Concentrate   . Morphine And Related Nausea And Vomiting    Last 2 - 3 days    REVIEW OF SYSTEMS:   Review of Systems  Constitutional: Negative for chills, fever, malaise/fatigue and weight loss.  HENT: Negative for ear discharge, ear pain, hearing loss and nosebleeds.   Eyes: Negative for blurred vision, double vision and photophobia.  Respiratory: Positive for shortness of breath. Negative for cough, hemoptysis and wheezing.   Cardiovascular: Positive for chest pain. Negative for palpitations, orthopnea and leg swelling.  Gastrointestinal: Positive for nausea. Negative for abdominal pain, constipation, diarrhea, heartburn, melena and vomiting.  Genitourinary: Negative for dysuria, frequency, hematuria and urgency.  Musculoskeletal: Positive for joint pain. Negative for back pain, myalgias and neck pain.  Skin: Negative for rash.  Neurological: Negative for dizziness, tremors, sensory change, speech change, focal weakness and headaches.  Endo/Heme/Allergies: Does not bruise/bleed easily.  Psychiatric/Behavioral: Negative for depression.    MEDICATIONS AT HOME:   Prior to Admission medications   Not on File      VITAL SIGNS:  Blood pressure (!) 158/78, pulse 69, temperature 97.7 F (36.5 C), temperature source Oral, resp. rate 14, height 5\' 3"  (1.6 m),  weight 65.3 kg (144 lb), SpO2 99 %.  PHYSICAL EXAMINATION:   Physical Exam  GENERAL:  72 y.o.-year-old patient lying in the bed with no acute distress.  EYES: Pupils equal, round, reactive to light and accommodation. No scleral icterus. Extraocular muscles intact.  HEENT: Head atraumatic, normocephalic. Oropharynx and nasopharynx clear.  NECK:  Supple, no jugular venous distention. No thyroid enlargement, no  tenderness.  LUNGS: Normal breath sounds bilaterally, no wheezing, rales,rhonchi or crepitation. No use of accessory muscles of respiration. Decreased bibasilar breath sounds. CARDIOVASCULAR: S1, S2 normal. No murmurs, rubs, or gallops.  ABDOMEN: Soft, nontender, nondistended. Bowel sounds present. No organomegaly or mass.  EXTREMITIES: No pedal edema, cyanosis, or clubbing.  NEUROLOGIC: Cranial nerves II through XII are intact. Muscle strength 5/5 in all extremities. Sensation intact. Gait not checked.  PSYCHIATRIC: The patient is alert and oriented x 3.  SKIN: No obvious rash, lesion, or ulcer.   LABORATORY PANEL:   CBC Recent Labs  Lab 04/16/17 1314  WBC 13.4*  HGB 13.9  HCT 43.0  PLT 217   ------------------------------------------------------------------------------------------------------------------  Chemistries  Recent Labs  Lab 04/16/17 1314  NA 136  K 4.4  CL 105  CO2 24  GLUCOSE 140*  BUN 21*  CREATININE 0.85  CALCIUM 9.6   ------------------------------------------------------------------------------------------------------------------  Cardiac Enzymes Recent Labs  Lab 04/16/17 1751  TROPONINI <0.03   ------------------------------------------------------------------------------------------------------------------  RADIOLOGY:  Dg Chest 2 View  Result Date: 04/16/2017 CLINICAL DATA:  Chest pain. EXAM: CHEST  2 VIEW COMPARISON:  None. FINDINGS: The heart size and mediastinal contours are within normal limits. Atherosclerosis of thoracic aorta is noted. No pneumothorax or pleural effusion is noted. Left lung is clear. Mild right basilar subsegmental atelectasis or scarring is noted. The visualized skeletal structures are unremarkable. IMPRESSION: Aortic atherosclerosis. Mild right basilar subsegmental atelectasis or scarring. Electronically Signed   By: Marijo Conception, M.D.   On: 04/16/2017 14:15    EKG:   Orders placed or performed during the hospital  encounter of 04/16/17  . EKG 12-Lead  . EKG 12-Lead  . ED EKG within 10 minutes  . ED EKG within 10 minutes  . ED EKG  . ED EKG  . EKG 12-Lead  . EKG 12-Lead    IMPRESSION AND PLAN:   Jonathon Cruz  is a 72 y.o. male with a known history of CAD with prior 45% blockage on cardiac catheterization, history of diabetes mellitus status post pancreatic islet cell transplantation, peripheral neuropathy with wheelchair-bound status, history of CK D stage IV on dialysis now status post renal transplant presents to hospital secondary to worsening chest pain.  1. Chest pain-likely stable angina -Admit to telemetry, recycle troponins. -Possible stress test in a.m. -Aspirin, statin and metoprolol, Nitropaste. -Cardiology consult  2. Peripheral neuropathy-seems to be at baseline. Continue home medications.  3. H/o renal failure, status post renal transplant,-stable follow-up with outpatient nephrologist and transplant physician. Continue immunosuppressants. Renal function is normal at this time.  4. DVT prophylaxis-Lovenox    All the records are reviewed and case discussed with ED provider. Management plans discussed with the patient, family and they are in agreement.  CODE STATUS: Full Code  TOTAL TIME TAKING CARE OF THIS PATIENT: 50 minutes.    Gladstone Lighter M.D on 04/16/2017 at 6:36 PM  Between 7am to 6pm - Pager - [phone]  After 6pm go to [email or web address] - password [email or web address] [facility]  Office  [phone]  CC: Primary care physician; Glade Stanford, MD

## 2017-04-16 NOTE — ED Triage Notes (Addendum)
Pt with chest pain that started 2 nights ago and woke him up. Pain left chest and radiating down left arm. Pt describes pain as being hit in chest with sledge hammer. Pt took 2 SL nitro with some relief. Pain has continued  But has lessened from 9/10 to 2/10 today. [facility]. Pt s/p kidney and pancreas transplant 2004.

## 2017-04-16 NOTE — ED Provider Notes (Signed)
[facility] Emergency Department Provider Note  ____________________________________________  Time seen: Approximately 5:23 PM  I have reviewed the triage vital signs and the nursing notes.   HISTORY  Chief Complaint Chest Pain   HPI Jonathon Cruz is a 72 y.o. male with a history of kidney and pancreas transplant in 2004, hypertension, hyperlipidemia, asthma who presents for evaluation of chest pain. Patient reports that 2 days ago he woke up in the morning to go to the bathroom. As he stood up he developed a severe pain on the left side of his chest that he describes as"someone hitting my chest with a sledgehammer". The pain was so severe the patient had a syncopal episode. When he woke up he was on the ground in his bedroom. Still having the pain. He took 2 nitros and after an hour the pain went away. Patient is unable to tell me why he didn't come to the hospital then. He reports that the pain was associated with nausea and shortness of breath and radiated down his left arm and his left hand was numb. Patient was fine for the rest of the day and yesterday. Today he had an appointment at the [facility] to go see his cardiologist. On his way to the [place] he started having chest pain that he describes as pressure located in the left side of his chest, radiating down his left arm, associated with nausea and shortness of breath. The pain is 2 out of 10. He reports this pain is different than the one he had 2 days ago. He denies prior history of heart attacks, family history of heart attacks, history of smoking. He denies ever having chest pain like this before.  No past medical history on file.  There are no active problems to display for this patient.  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 Smoking status: Former Research scientist (life sciences)  . Smokeless tobacco: Never Used  Substance Use Topics  . Alcohol use: Yes  . Drug use: Not on file    Review of Systems  Constitutional: Negative for fever. + syncope Eyes: Negative for visual changes. ENT: Negative for sore throat. Neck: No neck pain  Cardiovascular: + chest pain. Respiratory: + shortness of breath. Gastrointestinal: Negative for abdominal pain, vomiting or diarrhea. + nausea Genitourinary: Negative for dysuria. Musculoskeletal: Negative for back pain. Skin: Negative for rash. Neurological: Negative for headaches, weakness or numbness. Psych: No SI or HI  ____________________________________________   PHYSICAL EXAM:  VITAL SIGNS: ED Triage Vitals  Enc Vitals Group     BP 04/16/17 1309 (!) 112/48     Pulse Rate 04/16/17 1309 (!) 57     Resp 04/16/17 1605 13     Temp 04/16/17 1309 97.7 F (36.5 C)  Temp Source 04/16/17 1309 Oral     SpO2 04/16/17 1309 97 %     Weight 04/16/17 1309 144 lb (65.3 kg)     Height 04/16/17 1309 5\' 3"  (1.6 m)     Head Circumference --      Peak Flow --      Pain Score 04/16/17 1308 2     Pain Loc --      Pain Edu? --      Excl. in [place]? --     Constitutional: Alert and oriented. Well appearing and in no apparent distress. HEENT:      Head: Normocephalic and atraumatic.         Eyes: Conjunctivae are normal. Sclera is non-icteric.       Mouth/Throat: Mucous membranes are moist.       Neck:  Supple with no signs of meningismus. Cardiovascular: Regular rate and rhythm. No murmurs, gallops, or rubs. 2+ symmetrical distal pulses are present in all extremities. No JVD. Respiratory: Normal respiratory effort. Lungs are clear to auscultation bilaterally. No wheezes, crackles, or rhonchi.  Gastrointestinal: Soft, non tender, and non distended with positive bowel sounds. No rebound or guarding. Musculoskeletal: Nontender with normal range of motion in all extremities. No edema, cyanosis, or erythema of extremities. Neurologic: Normal speech and language. Face is symmetric. Moving all extremities. No gross focal neurologic deficits are appreciated. Skin: Skin is warm, dry and intact. No rash noted. Psychiatric: Mood and affect are normal. Speech and behavior are normal.  ____________________________________________   LABS (all labs ordered are listed, but only abnormal results are displayed)  Labs Reviewed  BASIC METABOLIC PANEL - Abnormal; Notable for the following components:      Result Value   Glucose, Bld 140 (*)    BUN 21 (*)    All other components within normal limits  CBC - Abnormal; Notable for the following components:   WBC 13.4 (*)    All other components within normal limits  TROPONIN I  TROPONIN I   ____________________________________________  EKG  ED ECG REPORT I, Rudene Re, the attending physician, personally viewed and interpreted this ECG.  12:59 - normal sinus rhythm, rate of 60, normal intervals, right axis deviation, T-wave inversions in inferior and lateral leads, no ST elevation.  15:56 - normal sinus rhythm, rate of 66, normal intervals, normal axis, persistent T-wave inversions in inferior and lateral leads with no ST elevation. ____________________________________________  RADIOLOGY  Cxr: Aortic atherosclerosis. Mild right basilar subsegmental atelectasis or  scarring. ____________________________________________   PROCEDURES  Procedure(s) performed: None Procedures Critical Care performed:  None ____________________________________________   INITIAL IMPRESSION / ASSESSMENT AND PLAN / ED COURSE   72 y.o. male with a history of kidney and pancreas transplant in 2004, hypertension, hyperlipidemia, asthma who presents for evaluation of chest pain. Patient with a severe episode of chest pain 2 days ago associated with syncope and a milder episode at this time with 2 out of 10 pain associated with shortness of breath and nausea and radiating down his left arm. EKG with no ischemic changes. First troponin is negative. Presentation concerning for unstable angina. [facility] Patient will be given nitroglycerin and aspirin. We'll cycle cardiac enzymes.    _________________________ 6:22 PM on 04/16/2017 -----------------------------------------  [facility] will be admitted here for further evaluation.   As part of my medical decision making, I reviewed the following data within the [place] notes reviewed and  incorporated, Labs reviewed , EKG interpreted , Radiograph reviewed , Discussed with admitting physician , Notes from prior ED visits and [place] Controlled Substance Database    Pertinent labs & imaging results that were available during my care of the patient were reviewed by me and considered in my medical decision making (see chart for details).    ____________________________________________   FINAL CLINICAL IMPRESSION(S) / ED DIAGNOSES  Final diagnoses:  Unstable angina ([place])      NEW MEDICATIONS STARTED DURING THIS VISIT:  ED Discharge Orders    None       Note:  This document was prepared using Dragon voice recognition software and may include unintentional dictation errors.    Alfred Levins, [place], MD 04/16/17 [id number]

## 2017-04-16 NOTE — ED Notes (Signed)
FN: pt presents with chest pain yesterday and syncopal episode with left hand numbness.

## 2017-04-16 NOTE — ED Notes (Signed)
Admitting MD at bedside.

## 2017-04-16 NOTE — ED Notes (Signed)
ED Provider at bedside. 

## 2017-04-17 ENCOUNTER — Observation Stay (HOSPITAL_BASED_OUTPATIENT_CLINIC_OR_DEPARTMENT_OTHER): Payer: Medicare HMO

## 2017-04-17 ENCOUNTER — Encounter: Payer: Self-pay | Admitting: Radiology

## 2017-04-17 DIAGNOSIS — R0789 Other chest pain: Secondary | ICD-10-CM

## 2017-04-17 DIAGNOSIS — G629 Polyneuropathy, unspecified: Secondary | ICD-10-CM | POA: Diagnosis not present

## 2017-04-17 DIAGNOSIS — I2510 Atherosclerotic heart disease of native coronary artery without angina pectoris: Secondary | ICD-10-CM | POA: Diagnosis not present

## 2017-04-17 DIAGNOSIS — R55 Syncope and collapse: Secondary | ICD-10-CM | POA: Diagnosis not present

## 2017-04-17 DIAGNOSIS — R079 Chest pain, unspecified: Secondary | ICD-10-CM | POA: Diagnosis not present

## 2017-04-17 DIAGNOSIS — I25110 Atherosclerotic heart disease of native coronary artery with unstable angina pectoris: Secondary | ICD-10-CM | POA: Diagnosis not present

## 2017-04-17 DIAGNOSIS — I10 Essential (primary) hypertension: Secondary | ICD-10-CM | POA: Diagnosis not present

## 2017-04-17 LAB — LIPID PANEL
CHOLESTEROL: 119 mg/dL (ref 0–200)
HDL: 60 mg/dL (ref >40)
LDL Cholesterol: 50 mg/dL (ref 0–99)
Total CHOL/HDL Ratio: 2 RATIO
Triglycerides: 47 mg/dL (ref <150)
VLDL: 9 mg/dL (ref 0–40)

## 2017-04-17 LAB — NM MYOCAR MULTI W/SPECT W/WALL MOTION / EF
CSEPHR: 44 %
LV dias vol: 67 mL (ref 62–150)
LV sys vol: 18 mL
NUC STRESS TID: 1.02
Peak HR: 67 {beats}/min
Rest HR: 56 {beats}/min

## 2017-04-17 LAB — BASIC METABOLIC PANEL
Anion gap: 5 (ref 5–15)
BUN: 17 mg/dL (ref 6–20)
CALCIUM: 9.1 mg/dL (ref 8.9–10.3)
CO2: 25 mmol/L (ref 22–32)
Chloride: 106 mmol/L (ref 101–111)
Creatinine, Ser: 0.9 mg/dL (ref 0.61–1.24)
GFR calc Af Amer: >60 mL/min (ref >60)
GFR calc non Af Amer: >60 mL/min (ref >60)
GLUCOSE: 85 mg/dL (ref 65–99)
Potassium: 4.1 mmol/L (ref 3.5–5.1)
Sodium: 136 mmol/L (ref 135–145)

## 2017-04-17 LAB — CBC
HCT: 40.7 % (ref 40.0–52.0)
Hemoglobin: 13.5 g/dL (ref 13.0–18.0)
MCH: 30.3 pg (ref 26.0–34.0)
MCHC: 33.1 g/dL (ref 32.0–36.0)
MCV: 91.6 fL (ref 80.0–100.0)
PLATELETS: 205 10*3/uL (ref 150–440)
RBC: 4.45 MIL/uL (ref 4.40–5.90)
RDW: 13.3 % (ref 11.5–14.5)
WBC: 9.5 10*3/uL (ref 3.8–10.6)

## 2017-04-17 LAB — TROPONIN I
TROPONIN I: 0.03 ng/mL — AB (ref <0.03)
Troponin I: 0.03 ng/mL (ref <0.03)

## 2017-04-17 MED ORDER — SODIUM CHLORIDE 0.9% FLUSH
3.0000 mL | Freq: Two times a day (BID) | INTRAVENOUS | Status: DC
  Administered 2017-04-17: 3 mL via INTRAVENOUS

## 2017-04-17 MED ORDER — SODIUM CHLORIDE 0.9% FLUSH
3.0000 mL | Freq: Two times a day (BID) | INTRAVENOUS | Status: DC
  Administered 2017-04-17 (×2): 3 mL via INTRAVENOUS

## 2017-04-17 MED ORDER — SODIUM CHLORIDE 0.9 % IV SOLN
INTRAVENOUS | Status: AC
  Administered 2017-04-17: 22:00:00 via INTRAVENOUS

## 2017-04-17 MED ORDER — SODIUM CHLORIDE 0.9 % IV SOLN: 250.0000 mL | INTRAVENOUS | Status: DC | PRN

## 2017-04-17 MED ORDER — TECHNETIUM TC 99M TETROFOSMIN IV KIT
32.0200 | PACK | Freq: Once | INTRAVENOUS | Status: AC | PRN
  Administered 2017-04-17: 32.02 via INTRAVENOUS

## 2017-04-17 MED ORDER — REGADENOSON 0.4 MG/5ML IV SOLN
0.4000 mg | Freq: Once | INTRAVENOUS | Status: AC
  Administered 2017-04-17: 0.4 mg via INTRAVENOUS

## 2017-04-17 MED ORDER — SODIUM CHLORIDE 0.9% FLUSH: 3.0000 mL | INTRAVENOUS | Status: DC | PRN

## 2017-04-17 MED ORDER — TECHNETIUM TC 99M TETROFOSMIN IV KIT
10.0000 | PACK | Freq: Once | INTRAVENOUS | Status: AC | PRN
  Administered 2017-04-17: 13.8 via INTRAVENOUS

## 2017-04-17 MED ORDER — ASPIRIN 81 MG PO CHEW
81.0000 mg | CHEWABLE_TABLET | ORAL | Status: AC
  Administered 2017-04-18: 81 mg via ORAL
  Filled 2017-04-17: qty 1

## 2017-04-17 NOTE — Plan of Care (Signed)
Plan of care reviewed with pt and wife. No question at this time.

## 2017-04-17 NOTE — Care Management Obs Status (Signed)
[facility] NOTIFICATION   Patient Details  Name: Jonathon Cruz MRN: [id number] Date of Birth: [birth year]   Medicare Observation Status Notification Given:  Yes Notice signed, one given to patient and the other to HIM for scanning   Katrina Stack, RN 04/17/2017, 1:48 PM

## 2017-04-17 NOTE — Progress Notes (Signed)
   Progress Note  Patient Name: Jonathon Cruz Date of Encounter: 04/17/2017  Primary Cardiologist: [facility]   Myocardial perfusion stress test found to be abnormal but probably low risk with basal inferior and inferolateral ischemia. We have discussed medical therapy versus coronary angiography to better define his anatomy. [name] reports hypotension in the past with long-acting nitrates and is hesitant to retry them. He has also noted brief episodes of mild chest pain lasting only a second or two throughout the day. He would like to proceed with cardiac catheterization tomorrow. I have reviewed the risks, indications, and alternatives to cardiac catheterization, possible angioplasty, and stenting with the patient. Risks include but are not limited to bleeding, infection, vascular injury, stroke, myocardial infection, arrhythmia, kidney injury, radiation-related injury in the case of prolonged fluoroscopy use, emergency cardiac surgery, and death. The patient understands the risks of serious complication is 1-2 in 0131 with diagnostic cardiac cath and 1-2% or less with angioplasty/stenting. I will make [name] NPO after midnight with gentle hydration tonight.  Signed, Nelva Bush, MD  04/17/2017, 5:41 PM

## 2017-04-17 NOTE — Consult Note (Signed)
Cardiology Consultation:   Patient ID: Jonathon Cruz; [id number]; 06-23-1945   Admit date: 04/16/2017 Date of Consult: 04/17/2017  Primary Care Provider: Glade Stanford, MD Primary Cardiologist: new to [facility] - consult by End (followed by [facility])   Patient Profile:   Jonathon Cruz is a 72 y.o. male with a hx of nonobstructive CAD by [facility] 0962, diastolic dysfunction, agent orange exposure, type I diabetes s/p pancreatic islet cell transplant in 2004, CKD stage V s/p previously on HD x 3 years s/p renal transplant in 2004, chronic left hip/leg pain, peripheral neuropathy, and wheelchair bound  who is being seen today for the evaluation of chest pain at the request of Dr. Tressia Miners.  History of Present Illness:   Jonathon Cruz underwent prior LHC in 2013 at [facility] that showed nonocclusive CAD with proximal RCA 25% stenosis, D2 25% stenosis, EF 64%. Echo 2013 showed EF > 55%, Gr2DD, moderate mitral regurgitation, trivial tricuspid regurgitation. He does report a possible LHC through [facility], though he does not recall when this was done and we do not have record of this.   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 Pain was brief and intermittent, lasting only a couple seconds and self resolving. Pain occurred several times. There was some associated nausea, otherwise no associated symptoms. He has never had pain similar to this in the past. Never with exertional pain, though he is wheelchair bound. He again had a similar pain on 1/14 while en [facility]. He called their office and he was advised to go to the nearest ED.   Upon the patient's arrival to [facility] they were found to have stable vitals. EKG showed inferolateral TWI as detailed below, CXR showed no acute process with aortic atherosclerosis. Labs showed troponin  negative x 4, LDL 50, SCr 0.85, K+ 4.4, WBC 13.4-->9.5, HGB 13.9, PLT 217. He was given ASA 324 mg x 1 and nitropaste was applied. He has remained chest pain free since his admission. IM has placed an order for cardiology consult and nuclear stress test.   This morning he is without pain. Wife and service dog are at bedside.   Past Medical History:  Diagnosis Date  . CAD (coronary artery disease)   . History of pancreatic islet cell transplantation   . Neuropathy   . Renal transplant recipient   . WPW (Wolff-Parkinson-White syndrome)     Past Surgical History:  Procedure Laterality Date  . COMBINED KIDNEY-PANCREAS TRANSPLANT    . EYE SURGERY    . KIDNEY TRANSPLANT       Home Meds: Prior to Admission medications   Medication Sig Start Date End Date Taking? Authorizing Provider  acetaminophen (TYLENOL) 325 MG tablet Take 650 mg by mouth every 6 (six) hours as needed.   Yes [provider]  albuterol (PROVENTIL HFA;VENTOLIN HFA) 108 (90 Base) MCG/ACT inhaler Inhale 2 puffs into the lungs every 6 (six) hours as needed for wheezing or shortness of breath.   Yes [provider]  aspirin EC 81 MG tablet Take 81 mg by mouth daily.   Yes [provider]  atorvastatin (LIPITOR) 80 MG tablet Take 80 mg by mouth daily.   Yes [provider]  carboxymethylcellulose 1 % ophthalmic solution Apply 1 drop to eye 3 (three)  times daily.   Yes [provider]  Cholecalciferol (VITAMIN D3) 10000 units TABS Take 1 tablet by mouth daily.   Yes [provider]  DULoxetine (CYMBALTA) 30 MG capsule Take 30 mg by mouth daily.   Yes [provider]  hydrocortisone 2.5 % lotion Apply 1 application topically 2 (two) times daily.   Yes [provider]  ketoconazole (NIZORAL) 2 % shampoo Apply 1 application topically 2 (two) times a week.   Yes [provider]  lisinopril (PRINIVIL,ZESTRIL) 5 MG tablet Take 2.5 mg by mouth daily.   Yes  [provider]  magnesium oxide (MAG-OX) 400 MG tablet Take 400 mg by mouth 2 (two) times daily.   Yes [provider]  metoprolol tartrate (LOPRESSOR) 25 MG tablet Take 25 mg by mouth 2 (two) times daily.   Yes [provider]  Multiple Vitamins-Minerals (PRESERVISION AREDS PO) Take 1 capsule by mouth 2 (two) times daily.   Yes [provider]  mycophenolate (CELLCEPT) 250 MG capsule Take 250 mg by mouth 2 (two) times daily.   Yes [provider]  nitroGLYCERIN (NITROSTAT) 0.4 MG SL tablet Place 0.4 mg under the tongue every 5 (five) minutes as needed for chest pain.   Yes [provider]  predniSONE (DELTASONE) 5 MG tablet Take 7.5 mg by mouth daily with breakfast.   Yes [provider]  tacrolimus (PROGRAF) 1 MG capsule Take 3 mg by mouth 2 (two) times daily.   Yes [provider]    Inpatient Medications: Scheduled Meds: . artificial tears   Both Eyes TID  . aspirin  81 mg Oral Daily  . atorvastatin  40 mg Oral q1800  . cholecalciferol  1,000 Units Oral Daily  . DULoxetine  30 mg Oral Daily  . enoxaparin (LOVENOX) injection  40 mg Subcutaneous Q24H  . lisinopril  2.5 mg Oral Daily  . magnesium oxide  400 mg Oral BID  . metoprolol tartrate  25 mg Oral BID  . multivitamin-lutein  1 capsule Oral BID  . mycophenolate  250 mg Oral BID  . nitroGLYCERIN  0.5 inch Topical Q6H  . predniSONE  7.5 mg Oral Q breakfast  . tacrolimus  3 mg Oral BID   Continuous Infusions:  PRN Meds: acetaminophen **OR** acetaminophen, HYDROmorphone (DILAUDID) injection, ibuprofen, nitroGLYCERIN, ondansetron **OR** ondansetron (ZOFRAN) IV  Allergies:   Allergies  Allergen Reactions  . Codeine Nausea And Vomiting    Last for days  . Grapefruit Concentrate   . Morphine And Related Nausea And Vomiting    Last 2 - 3 days  . Valium [Diazepam] Hypertension    Social History:   Social History   Socioeconomic History  . Marital  status: Married    Spouse name: Not on file  . Number of children: Not on file  . Years of education: Not on file  . Highest education level: Not on file  Social Needs  . Financial resource strain: Not on file  . Food insecurity - worry: Not on file  . Food insecurity - inability: Not on file  . Transportation needs - medical: Not on file  . Transportation needs - non-medical: Not on file  Occupational History  . Not on file  Tobacco Use  . Smoking status: Former Research scientist (life sciences)  . Smokeless tobacco: Never Used  Substance and Sexual Activity  . Alcohol use: Yes    Comment: occasional wine  . Drug use: No  . Sexual activity: Not on file  Other  Topics Concern  . Not on file  Social History Narrative   Wheelchair-bound due to his neuropathy. Lives at home with his wife     Family History:   Family History  Problem Relation Age of Onset  . CAD Mother   . Dementia Mother   . CAD Paternal Grandfather     ROS:  Review of Systems  Constitutional: Positive for malaise/fatigue. Negative for chills, diaphoresis, fever and weight loss.  HENT: Negative for congestion.   Eyes: Negative for discharge and redness.  Respiratory: Negative for cough, hemoptysis, sputum production, shortness of breath and wheezing.   Cardiovascular: Positive for chest pain. Negative for palpitations, orthopnea, claudication, leg swelling and PND.  Gastrointestinal: Positive for nausea. Negative for abdominal pain, blood in stool, heartburn, melena and vomiting.  Genitourinary: Negative for hematuria.  Musculoskeletal: Negative for falls and myalgias.  Skin: Negative for rash.  Neurological: Negative for dizziness, tingling, tremors, sensory change, speech change, focal weakness, loss of consciousness and weakness.  Endo/Heme/Allergies: Does not bruise/bleed easily.  Psychiatric/Behavioral: Negative for substance abuse. The patient is not nervous/anxious.   All other systems reviewed and are negative.      Physical Exam/Data:   Vitals:   04/16/17 2230 04/16/17 2330 04/17/17 0454 04/17/17 0722  BP: 120/67 (!) 150/71 110/65 116/62  Pulse: (!) 58 66 62 62  Resp: 16 18 18 16   Temp:  (!) 97.5 F (36.4 C) 97.7 F (36.5 C) 98.3 F (36.8 C)  TempSrc:  Oral Oral   SpO2: 97% 100% 95% 97%  Weight:  131 lb 8 oz (59.6 kg)    Height:        Intake/Output Summary (Last 24 hours) at 04/17/2017 0940 Last data filed at 04/17/2017 0600 Gross per 24 hour  Intake 500 ml  Output 250 ml  Net 250 ml   Filed Weights   04/16/17 1309 04/16/17 2330  Weight: 144 lb (65.3 kg) 131 lb 8 oz (59.6 kg)   Body mass index is 23.29 kg/m.   Physical Exam: General: Well developed, well nourished, in no acute distress. Head: Normocephalic, atraumatic, sclera non-icteric, no xanthomas, nares without discharge.  Neck: Negative for carotid bruits. JVD not elevated. Lungs: Clear bilaterally to auscultation without wheezes, rales, or rhonchi. Breathing is unlabored. Heart: RRR with S1 S2. No murmurs, rubs, or gallops appreciated. Abdomen: Soft, non-tender, non-distended with normoactive bowel sounds. No hepatomegaly. No rebound/guarding. No obvious abdominal masses. Msk:  Strength and tone appear normal for age. Extremities: No clubbing or cyanosis. No edema. Distal pedal pulses are 2+ and equal bilaterally. Neuro: Alert and oriented X 3. No facial asymmetry. No focal deficit. Moves all extremities spontaneously. Psych:  Responds to questions appropriately with a normal affect.   EKG:  The EKG was personally reviewed and demonstrates: NSR, 66 bpm, inferolateral TWI Telemetry:  Telemetry was personally reviewed and demonstrates: NSR  Weights: Filed Weights   04/16/17 1309 04/16/17 2330  Weight: 144 lb (65.3 kg) 131 lb 8 oz (59.6 kg)    Relevant CV Studies: LHC 2013: CORONARY ARTERIOGRAMS  Dominance:  right  SA node origin:  right  AV node origin:  right  Branch                    Stenosis  Lesion Type   TIMI Flow  ------------------------  --------  -----------  ---------  Right Coronary Artery     Prox RCA  25%       Discrete     RPAV  (Small)     Ant R Vent  (Small)     R PDA  (Large)     RPL1  (Small)     RPL2  (Small)     RPL3  (Small)  Left Circumflex     Dist LCX  (Small)     OM1  (Small)     OM3  (Large)  Left Anterior Descending     D1  (Small)     D2                     25%       Discrete     D3  (Small)  Left Main                 normal  Left Circumflex           Normal  DIAGNOSTIC SUMMARY  Coronary Artery Disease                    RCA system:  insignificant      Left Main:  normal                     No significant CAD indicated      LAD system:  insignificant         Left Ventriculogram      LCX system:  normal                    Ejection Fraction:   64%   TTE 2013: INTERPRETATION ---------------------------------------------------------------  NORMAL LEFT VENTRICULAR FUNCTION WITH MODERATE LVH  DIASTOLIC FUNCTION CLASS: RELAXATION ABNORMALITY (GRADE 2) CORRESPONDS TO  PSEUDONORMAL  VALVULAR REGURGITATION: MODERATE MR, TRIVIAL TR  NO VALVULAR STENOSIS  Laboratory Data:  Chemistry Recent Labs  Lab 04/16/17 1314 04/17/17 0718  NA 136 136  K 4.4 4.1  CL 105 106  CO2 24 25  GLUCOSE 140* 85  BUN 21* 17  CREATININE 0.85 0.90  CALCIUM 9.6 9.1  GFRNONAA >60 >60  GFRAA >60 >60  ANIONGAP 7 5    No results for input(s): PROT, ALBUMIN, AST, ALT, ALKPHOS, BILITOT in the last 168 hours. Hematology Recent Labs  Lab 04/16/17 1314 04/17/17 0718  WBC 13.4* 9.5  RBC 4.62 4.45  HGB 13.9 13.5  HCT 43.0 40.7  MCV 93.0 91.6  MCH 30.1 30.3  MCHC 32.4 33.1  RDW 13.2 13.3  PLT 217 205   Cardiac Enzymes Recent Labs  Lab 04/16/17 1314 04/16/17 1751 04/17/17 0030 04/17/17 0718  TROPONINI <0.03 <0.03 <0.03 0.03*   No results for input(s): TROPIPOC in the last 168 hours.  BNPNo results for input(s): BNP, PROBNP in the last 168 hours.   DDimer No results for input(s): DDIMER in the last 168 hours.  Radiology/Studies:  Dg Chest 2 View  Result Date: 04/16/2017 IMPRESSION: Aortic atherosclerosis. Mild right basilar subsegmental atelectasis or scarring. Electronically Signed   By: Marijo Conception, M.D.   On: 04/16/2017 14:15    Assessment and Plan:   1. Chest pain with moderate risk of cardiac etiology: -Both typical and atypical features -Has ruled out -Currently chest pain free -EKG is abnormal, though this may be consistent with some prior tracings -For Lexiscan Myoview this morning per IM -Given his history of renal transplant, would ideally like to avoid LHC unless dictated by nuclear stress test -If normal stress test,  [facility] -Could consider trial of Imdur with close outpatient follow up (known nonocclusive CAD by [facility] in 2013). Patient is uncertain if there has been a LHC since 2013 (wife reports he has not had a cath site they got married in 2014) -Continue ASA, Lipitor, metoprolol, and lisinopril   2. Agent orange exposure/type I diabetes/CKD stage V: -Status post pancreatic islet cell transplant and renal transplant as above  3. Peripheral neuropathy: -Per IM   For questions or updates, please contact [place] Please consult [email or web address] for contact info under Cardiology/STEMI.   Signed, Christell Faith, PA-C [place] Pager: [phone] 04/17/2017, 9:40 AM

## 2017-04-18 ENCOUNTER — Telehealth: Payer: Self-pay | Admitting: Cardiovascular Disease

## 2017-04-18 ENCOUNTER — Encounter: Payer: Self-pay | Admitting: *Deleted

## 2017-04-18 ENCOUNTER — Encounter
Admission: EM | Disposition: A | Discharge status: Home / Self Care | Payer: Self-pay | Source: Home / Self Care | Attending: Emergency Medicine

## 2017-04-18 DIAGNOSIS — I25110 Atherosclerotic heart disease of native coronary artery with unstable angina pectoris: Secondary | ICD-10-CM | POA: Diagnosis not present

## 2017-04-18 DIAGNOSIS — G629 Polyneuropathy, unspecified: Secondary | ICD-10-CM | POA: Diagnosis not present

## 2017-04-18 DIAGNOSIS — R079 Chest pain, unspecified: Secondary | ICD-10-CM | POA: Diagnosis not present

## 2017-04-18 DIAGNOSIS — I10 Essential (primary) hypertension: Secondary | ICD-10-CM | POA: Diagnosis not present

## 2017-04-18 DIAGNOSIS — I2510 Atherosclerotic heart disease of native coronary artery without angina pectoris: Secondary | ICD-10-CM | POA: Diagnosis not present

## 2017-04-18 HISTORY — PX: LEFT HEART CATH AND CORONARY ANGIOGRAPHY: CATH118249

## 2017-04-18 LAB — PROTIME-INR
INR: 0.92
Prothrombin Time: 12.3 seconds (ref 11.4–15.2)

## 2017-04-18 SURGERY — LEFT HEART CATH AND CORONARY ANGIOGRAPHY
Anesthesia: Moderate Sedation

## 2017-04-18 MED ORDER — FENTANYL CITRATE (PF) 100 MCG/2ML IJ SOLN
INTRAMUSCULAR | Status: AC
  Filled 2017-04-18: qty 2

## 2017-04-18 MED ORDER — MIDAZOLAM HCL 2 MG/2ML IJ SOLN
INTRAMUSCULAR | Status: AC
  Filled 2017-04-18: qty 2

## 2017-04-18 MED ORDER — HEPARIN (PORCINE) IN NACL 2-0.9 UNIT/ML-% IJ SOLN
INTRAMUSCULAR | Status: AC
  Filled 2017-04-18: qty 500

## 2017-04-18 MED ORDER — MIDAZOLAM HCL 2 MG/2ML IJ SOLN
INTRAMUSCULAR | Status: DC | PRN
  Administered 2017-04-18: 1 mg via INTRAVENOUS

## 2017-04-18 MED ORDER — IOPAMIDOL (ISOVUE-300) INJECTION 61%
INTRAVENOUS | Status: DC | PRN
  Administered 2017-04-18: 70 mL via INTRA_ARTERIAL

## 2017-04-18 SURGICAL SUPPLY — 10 items
CATH 5FR JL4 DIAGNOSTIC (CATHETERS) ×1 IMPLANT
CATH INFINITI 5FR ANG PIGTAIL (CATHETERS) ×1 IMPLANT
CATH INFINITI JR4 5F (CATHETERS) ×1 IMPLANT
DEVICE CLOSURE MYNXGRIP 5F (Vascular Products) ×1 IMPLANT
KIT MANI 3VAL PERCEP (MISCELLANEOUS) ×2 IMPLANT
NDL PERC 18GX7CM (NEEDLE) IMPLANT
NEEDLE PERC 18GX7CM (NEEDLE) ×2 IMPLANT
PACK CARDIAC CATH (CUSTOM PROCEDURE TRAY) ×2 IMPLANT
SHEATH AVANTI 5FR X 11CM (SHEATH) ×1 IMPLANT
WIRE EMERALD 3MM-J .035X150CM (WIRE) ×1 IMPLANT

## 2017-04-18 NOTE — Telephone Encounter (Signed)
TCM....  Patient  discharged   They saw Dr. Rockey Situ   They are scheduled to see Gollan on 1/23 at 4   They were seen for Chest pain s/p cath   They need to be seen within 7-10 days

## 2017-04-18 NOTE — Progress Notes (Signed)
Cardiac cath today Moderate proximal and mid LAD disease, Normal LV function, >55% Medical management recommended Stay on current meds, asa/lipitor  Signed, Esmond Plants, MD, Ph.D [facility]

## 2017-04-18 NOTE — Discharge Summary (Signed)
[place] at [facility], 72 y.o., DOB Aug 23, 1945, MRN [id number]. Admission date: 04/16/2017 Discharge Date 04/18/2017 Primary MD Glade Stanford, MD Admitting Physician Gladstone Lighter, MD  Admission Diagnosis  Unstable angina [facility]) [I20.0]  Discharge Diagnosis   Active Problems: Chest pain noncardiac status post cardiac cath Peripheral neuropathy Status post renal transplant History of pancreatic islet cell transplantation    Hospital Course Patient 72 year old who presented with chest pain.  Patient had a stress test which was abnormal therefore underwent a cardiac cath.  Cardiac cath showed nonobstructive coronary artery disease.  Cardiology recommended continuing current management.  Patient is currently chest pain-free and feeling well and stable for discharge.             Consults  cardiology  Significant Tests:  See full reports for all details    Dg Chest 2 View  Result Date: 04/16/2017 CLINICAL DATA:  Chest pain. EXAM: CHEST  2 VIEW COMPARISON:  None. FINDINGS: The heart size and mediastinal contours are within normal limits. Atherosclerosis of thoracic aorta is noted. No pneumothorax or pleural effusion is noted. Left lung is clear. Mild right basilar subsegmental atelectasis or scarring is noted. The visualized skeletal structures are unremarkable. IMPRESSION: Aortic atherosclerosis. Mild right basilar subsegmental atelectasis or scarring. Electronically Signed   By: Marijo Conception, M.D.   On: 04/16/2017 14:15   Nm Myocar Multi W/spect W/wall Motion / Ef  Result Date: 04/17/2017  Abnormal, probably low risk myocardial perfusion stress test.  There is a small in size, mild in severity, reversible defect involving the basal inferior and inferolateral segments consistent with ischemia and less likely artifact.  The left ventricular ejection fraction is normal (65%) with normal wall motion.        Today    Subjective:   Jonathon Cruz patient feels well denies any chest pain Objective:   Blood pressure 135/68, pulse 67, temperature 97.6 F (36.4 C), temperature source Oral, resp. rate 14, height 5\' 3"  (1.6 m), weight 131 lb (59.4 kg), SpO2 95 %.  .  Intake/Output Summary (Last 24 hours) at 04/18/2017 1635 Last data filed at 04/18/2017 1500 Gross per 24 hour  Intake 1778.75 ml  Output 2300 ml  Net -521.25 ml    Exam VITAL SIGNS: Blood pressure 135/68, pulse 67, temperature 97.6 F (36.4 C), temperature source Oral, resp. rate 14, height 5\' 3"  (1.6 m), weight 131 lb (59.4 kg), SpO2 95 %.  GENERAL:  72 y.o.-year-old patient lying in the bed with no acute distress.  EYES: Pupils equal, round, reactive to light and accommodation. No scleral icterus. Extraocular muscles intact.  HEENT: Head atraumatic, normocephalic. Oropharynx and nasopharynx clear.  NECK:  Supple, no jugular venous distention. No thyroid enlargement, no tenderness.  LUNGS: Normal breath sounds bilaterally, no wheezing, rales,rhonchi or crepitation. No use of accessory muscles of respiration.  CARDIOVASCULAR: S1, S2 normal. No murmurs, rubs, or gallops.  ABDOMEN: Soft, nontender, nondistended. Bowel sounds present. No organomegaly or mass.  EXTREMITIES: No pedal edema, cyanosis, or clubbing.  NEUROLOGIC: Cranial nerves II through XII are intact. Muscle strength 5/5 in all extremities. Sensation intact. Gait not checked.  PSYCHIATRIC: The patient is alert and oriented x 3.  SKIN: No obvious rash, lesion, or ulcer.   Data Review     CBC w Diff:  Lab Results  Component Value Date   WBC 9.5 04/17/2017   HGB 13.5 04/17/2017   HCT 40.7 04/17/2017   PLT 205 04/17/2017  CMP:  Lab Results  Component Value Date   NA 136 04/17/2017   K 4.1 04/17/2017   CL 106 04/17/2017   CO2 25 04/17/2017   BUN 17 04/17/2017   CREATININE 0.90 04/17/2017  .  Micro Results No results found for this or any previous visit (from the  past 240 hour(s)).      Code Status Orders  (From admission, onward)        Start     Ordered   04/16/17 2339  Full code  Continuous     04/16/17 2339    Code Status History    Date Active Date Inactive Code Status Order ID Comments User Context   This patient has a current code status but no historical code status.    Advance Directive Documentation     Most Recent Value  Type of Advance Directive  Healthcare Power of Attorney  Pre-existing out of facility DNR order (yellow form or pink MOST form)  No data  "MOST" Form in Place?  No data          Follow-up Information    Glade Stanford, MD Follow up in 1 week(s).   Specialty:  Family Medicine Contact information: [place] [address] [phone]           Discharge Medications   Allergies as of 04/18/2017      Reactions   Codeine Nausea And Vomiting   Last for days   Grapefruit Concentrate    Morphine And Related Nausea And Vomiting   Last 2 - 3 days   Valium [diazepam] Hypertension      Medication List    TAKE these medications   acetaminophen 325 MG tablet Commonly known as:  TYLENOL Take 650 mg by mouth every 6 (six) hours as needed.   albuterol 108 (90 Base) MCG/ACT inhaler Commonly known as:  PROVENTIL HFA;VENTOLIN HFA Inhale 2 puffs into the lungs every 6 (six) hours as needed for wheezing or shortness of breath.   aspirin EC 81 MG tablet Take 81 mg by mouth daily.   atorvastatin 80 MG tablet Commonly known as:  LIPITOR Take 80 mg by mouth daily. Notes to patient:  TAKE WITH DINNER   carboxymethylcellulose 1 % ophthalmic solution Apply 1 drop to eye 3 (three) times daily. Notes to patient:  None given today   DULoxetine 30 MG capsule Commonly known as:  CYMBALTA Take 30 mg by mouth daily.   hydrocortisone 2.5 % lotion Apply 1 application topically 2 (two) times daily.   ketoconazole 2 % shampoo Commonly known as:  NIZORAL Apply 1 application topically 2  (two) times a week.   lisinopril 5 MG tablet Commonly known as:  PRINIVIL,ZESTRIL Take 2.5 mg by mouth daily.   magnesium oxide 400 MG tablet Commonly known as:  MAG-OX Take 400 mg by mouth 2 (two) times daily.   metoprolol tartrate 25 MG tablet Commonly known as:  LOPRESSOR Take 25 mg by mouth 2 (two) times daily.   mycophenolate 250 MG capsule Commonly known as:  CELLCEPT Take 250 mg by mouth 2 (two) times daily.   nitroGLYCERIN 0.4 MG SL tablet Commonly known as:  NITROSTAT Place 0.4 mg under the tongue every 5 (five) minutes as needed for chest pain.   predniSONE 5 MG tablet Commonly known as:  DELTASONE Take 7.5 mg by mouth daily with breakfast. Notes to patient:  TAKE WITH BREAKFAST   PRESERVISION AREDS PO Take 1 capsule by mouth  2 (two) times daily.   tacrolimus 1 MG capsule Commonly known as:  PROGRAF Take 3 mg by mouth 2 (two) times daily.   Vitamin D3 10000 units Tabs Take 1 tablet by mouth daily.          Total Time in preparing paper work, data evaluation and todays exam - 3 minutes  Dustin Flock M.D on 04/18/2017 at 4:35 PM  [facility]   Office  [phone]

## 2017-04-18 NOTE — Progress Notes (Signed)
Returned from cardiac cath.  Right Fem site is clean, surrounding skin is soft. Pedial pulses are strong

## 2017-04-18 NOTE — Telephone Encounter (Signed)
Admitted

## 2017-04-18 NOTE — Progress Notes (Signed)
Discharged to home with wife.  Thought he would have cardiology follow up at [facility], but now is afraid it will take weeks to get an appointment.  I gave him Dr. Donivan Scull office number and instructed him to schedule a 1 week follow up.  No changes to medication.  Detailed instructions on femoral site care.

## 2017-04-18 NOTE — Progress Notes (Signed)
Jonathon Cruz at [facility]                                                                                                                                                                                  Patient Demographics   Jonathon Cruz, is a 72 y.o. male, DOB - 29-Sep-1945, YQM:[id number]  Admit date - 04/16/2017   Admitting Physician Gladstone Lighter, MD  Outpatient Primary MD for the patient is Glade Stanford, MD   LOS - 0  Subjective: Patient admitted with chest pain had a stress test test was abnormal  No further chest pain  Review of Systems:   CONSTITUTIONAL: No documented fever. No fatigue, weakness. No weight gain, no weight loss.  EYES: No blurry or double vision.  ENT: No tinnitus. No postnasal drip. No redness of the oropharynx.  RESPIRATORY: No cough, no wheeze, no hemoptysis. No dyspnea.  CARDIOVASCULAR: No chest pain. No orthopnea. No palpitations. No syncope.  GASTROINTESTINAL: No nausea, no vomiting or diarrhea. No abdominal pain. No melena or hematochezia.  GENITOURINARY: No dysuria or hematuria.  ENDOCRINE: No polyuria or nocturia. No heat or cold intolerance.  HEMATOLOGY: No anemia. No bruising. No bleeding.  INTEGUMENTARY: No rashes. No lesions.  MUSCULOSKELETAL: No arthritis. No swelling. No gout.  NEUROLOGIC: No numbness, tingling, or ataxia. No seizure-type activity.  PSYCHIATRIC: No anxiety. No insomnia. No ADD.    Vitals:   Vitals:   04/18/17 1045 04/18/17 1055 04/18/17 1100 04/18/17 1118  BP: (!) 157/78 (!) 156/79  135/68  Pulse: 75 69 70 67  Resp: 20 16  14   Temp:    97.6 F (36.4 C)  TempSrc:    Oral  SpO2: 93% 96% 96% 95%  Weight:      Height:        Wt Readings from Last 3 Encounters:  04/18/17 131 lb (59.4 kg)     Intake/Output Summary (Last 24 hours) at 04/18/2017 1638 Last data filed at 04/18/2017 1500 Gross per 24 hour  Intake 1778.75 ml  Output 2300 ml  Net -521.25 ml    Physical Exam:    GENERAL: Pleasant-appearing in no apparent distress.  HEAD, EYES, EARS, NOSE AND THROAT: Atraumatic, normocephalic. Extraocular muscles are intact. Pupils equal and reactive to light. Sclerae anicteric. No conjunctival injection. No oro-pharyngeal erythema.  NECK: Supple. There is no jugular venous distention. No bruits, no lymphadenopathy, no thyromegaly.  HEART: Regular rate and rhythm,. No murmurs, no rubs, no clicks.  LUNGS: Clear to auscultation bilaterally. No rales or rhonchi. No wheezes.  ABDOMEN: Soft, flat, nontender, nondistended. Has good bowel sounds. No hepatosplenomegaly appreciated.  EXTREMITIES: No evidence of any cyanosis, clubbing, or  peripheral edema.  +2 pedal and radial pulses bilaterally.  NEUROLOGIC: The patient is alert, awake, and oriented x3 with no focal motor or sensory deficits appreciated bilaterally.  SKIN: Moist and warm with no rashes appreciated.  Psych: Not anxious, depressed LN: No inguinal LN enlargement    Antibiotics   Anti-infectives (From admission, onward)   None      Medications   Scheduled Meds: . artificial tears   Both Eyes TID  . aspirin  81 mg Oral Daily  . atorvastatin  40 mg Oral q1800  . cholecalciferol  1,000 Units Oral Daily  . DULoxetine  30 mg Oral Daily  . enoxaparin (LOVENOX) injection  40 mg Subcutaneous Q24H  . lisinopril  2.5 mg Oral Daily  . magnesium oxide  400 mg Oral BID  . metoprolol tartrate  25 mg Oral BID  . multivitamin-lutein  1 capsule Oral BID  . mycophenolate  250 mg Oral BID  . nitroGLYCERIN  0.5 inch Topical Q6H  . predniSONE  7.5 mg Oral Q breakfast  . sodium chloride flush  3 mL Intravenous Q12H  . tacrolimus  3 mg Oral BID   Continuous Infusions: PRN Meds:.acetaminophen **OR** acetaminophen, HYDROmorphone (DILAUDID) injection, ibuprofen, nitroGLYCERIN, ondansetron **OR** ondansetron (ZOFRAN) IV, sodium chloride flush   Data Review:   Micro Results No results found for this or any previous  visit (from the past 240 hour(s)).  Radiology Reports Dg Chest 2 View  Result Date: 04/16/2017 CLINICAL DATA:  Chest pain. EXAM: CHEST  2 VIEW COMPARISON:  None. FINDINGS: The heart size and mediastinal contours are within normal limits. Atherosclerosis of thoracic aorta is noted. No pneumothorax or pleural effusion is noted. Left lung is clear. Mild right basilar subsegmental atelectasis or scarring is noted. The visualized skeletal structures are unremarkable. IMPRESSION: Aortic atherosclerosis. Mild right basilar subsegmental atelectasis or scarring. Electronically Signed   By: Marijo Conception, M.D.   On: 04/16/2017 14:15   Nm Myocar Multi W/spect W/wall Motion / Ef  Result Date: 04/17/2017  Abnormal, probably low risk myocardial perfusion stress test.  There is a small in size, mild in severity, reversible defect involving the basal inferior and inferolateral segments consistent with ischemia and less likely artifact.  The left ventricular ejection fraction is normal (65%) with normal wall motion.      CBC Recent Labs  Lab 04/16/17 1314 04/17/17 0718  WBC 13.4* 9.5  HGB 13.9 13.5  HCT 43.0 40.7  PLT 217 205  MCV 93.0 91.6  MCH 30.1 30.3  MCHC 32.4 33.1  RDW 13.2 13.3    Chemistries  Recent Labs  Lab 04/16/17 1314 04/17/17 0718  NA 136 136  K 4.4 4.1  CL 105 106  CO2 24 25  GLUCOSE 140* 85  BUN 21* 17  CREATININE 0.85 0.90  CALCIUM 9.6 9.1   ------------------------------------------------------------------------------------------------------------------ estimated creatinine clearance is 61.5 mL/min (by C-G formula based on SCr of 0.9 mg/dL). ------------------------------------------------------------------------------------------------------------------ No results for input(s): HGBA1C in the last 72 hours. ------------------------------------------------------------------------------------------------------------------ Recent Labs    04/17/17 0030  CHOL 119   HDL 60  LDLCALC 50  TRIG 47  CHOLHDL 2.0   ------------------------------------------------------------------------------------------------------------------ No results for input(s): TSH, T4TOTAL, T3FREE, THYROIDAB in the last 72 hours.  Invalid input(s): FREET3 ------------------------------------------------------------------------------------------------------------------ No results for input(s): VITAMINB12, FOLATE, FERRITIN, TIBC, IRON, RETICCTPCT in the last 72 hours.  Coagulation profile Recent Labs  Lab 04/18/17 0422  INR 0.92    No results for input(s): DDIMER in the last 72 hours.  Cardiac Enzymes Recent Labs  Lab 04/17/17 0030 04/17/17 0718 04/17/17 1130  TROPONINI <0.03 0.03* 0.03*   ------------------------------------------------------------------------------------------------------------------ Invalid input(s): POCBNP    Assessment & Plan   Jonathon Cruz  is a 72 y.o. male with a known history of CAD with prior 45% blockage on cardiac catheterization, history of diabetes mellitus status post pancreatic islet cell transplantation, peripheral neuropathy with wheelchair-bound status, history of CK D stage IV on dialysis now status post renal transplant presents to hospital secondary to worsening chest pain.  1. Chest pain- With abnormal stress test Plan for cardiac cath tomorrow Continue current therapy with aspirin  2. Peripheral neuropathy-seems to be at baseline. Continue home medications.  3. H/o renal failure, status post renal transplant,-stable follow-up with outpatient nephrologist and transplant physician. Continue immunosuppressants. Renal function is normal at this time.  4. DVT prophylaxis-Lovenox        Code Status Orders  (From admission, onward)        Start     Ordered   04/16/17 2339  Full code  Continuous     04/16/17 2339    Code Status History    Date Active Date Inactive Code Status Order ID Comments User Context    This patient has a current code status but no historical code status.    Advance Directive Documentation     Most Recent Value  Type of Advance Directive  Healthcare Power of Attorney  Pre-existing out of facility DNR order (yellow form or pink MOST form)  No data  "MOST" Form in Place?  No data           Consults cardiology  DVT Prophylaxis  Lovenox  Lab Results  Component Value Date   PLT 205 04/17/2017     Time Spent in minutes   35 minutes  Greater than 50% of time spent in care coordination and counseling patient regarding the condition and plan of care.   Dustin Flock M.D on 04/18/2017 at 4:38 PM  Between 7am to 6pm - Pager - [phone]  After 6pm go to [email or web address] - password [email or web address] [facility] [place] [facility]   Office  [phone]

## 2017-04-19 NOTE — Telephone Encounter (Signed)
Patient contacted regarding discharge from [facility] on 04/18/17.   Patient understands to follow up with provider ? On 04/25/17 at 4pm at [facility] with Dr Rockey Situ.  Patient understands discharge instructions? Yes Patient understands medications and regiment? Yes  Patient understands to bring all medications to this visit? Yes   Patient and wife very impressed with the care they received from [facility]. They felt the staff went above and beyond in caring for the patient as well as his wife. They were very impressed.

## 2017-04-24 DIAGNOSIS — E109 Type 1 diabetes mellitus without complications: Secondary | ICD-10-CM | POA: Insufficient documentation

## 2017-04-24 DIAGNOSIS — Z992 Dependence on renal dialysis: Secondary | ICD-10-CM

## 2017-04-24 DIAGNOSIS — N186 End stage renal disease: Secondary | ICD-10-CM | POA: Insufficient documentation

## 2017-04-24 DIAGNOSIS — I2510 Atherosclerotic heart disease of native coronary artery without angina pectoris: Secondary | ICD-10-CM | POA: Insufficient documentation

## 2017-04-24 NOTE — Progress Notes (Signed)
Cardiology Office Note  Date:  04/25/2017   ID:  Jonathon Cruz, DOB 27-Nov-1945, MRN [id number]  PCP:  Jonathon Stanford, MD   Chief Complaint  Patient presents with  . other    S/p cardiac cath. Meds reviewed verbally with pt.    HPI:  Jonathon Cruz is a 72 year old man with history of  nonobstructive CAD  by cath in 03/2014  type 1 diabetes mellitus status post pancreas transplant,  end-stage renal disease status post kidney transplant, Done at [facility] 03/15/2003 chronic hip and leg pain complicated by peripheral neuropathy,  Left sided breast cancer, resection Presenting to the hospital with chest pain 04/17/2016 Who presents for follow-up after recent hospital discharge, follow-up of his coronary disease  He presented to the hospital last week with chest pain symptoms concerning for unstable angina, had abnormal stress test EKG with inferolateral T wave inversions Cardiac catheterization was performed 04/18/2017  Prox LAD lesion is 45% stenosed.  Mid LAD lesion is 50% stenosed.  The left ventricular ejection fraction is 55-65% by visual estimate.  LV end diastolic pressure is mildly elevated.  The left ventricular systolic function is normal.  Medical management recommended  In follow-up reports he has been feeling well Long discussion concerning his cardiac catheterization results, medications, previous history Hospital records reviewed with the patient in detail Reports blood pressure well controlled Has shooting pain down his legs, leg weakness, they seem to give out Covered by the [facility]  EKG personally reviewed by myself on todays visit Shows normal sinus rhythm rate 65 bpm ST abnormality V5, V6, 2, 3, aVF,  T-wave abnormality in lead 1 and aVL   PMH:   has a past medical history of CAD (coronary artery disease), History of pancreatic islet cell transplantation, Neuropathy, Renal transplant recipient, and WPW (Wolff-Parkinson-White syndrome).  PSH:    Past  Surgical History:  Procedure Laterality Date  . CARDIAC CATHETERIZATION    . [place]  . COMBINED KIDNEY-PANCREAS TRANSPLANT    . EYE SURGERY    . KIDNEY TRANSPLANT    . LEFT HEART CATH AND CORONARY ANGIOGRAPHY N/A 04/18/2017   Procedure: LEFT HEART CATH AND CORONARY ANGIOGRAPHY;  Surgeon: Minna Merritts, MD;  Location: [place] [facility];  Service: Cardiovascular;  Laterality: N/A;    Current Outpatient Medications  Medication Sig Dispense Refill  . acetaminophen (TYLENOL) 325 MG tablet Take 650 mg by mouth every 6 (six) hours as needed.    Marland Kitchen albuterol (PROVENTIL HFA;VENTOLIN HFA) 108 (90 Base) MCG/ACT inhaler Inhale 2 puffs into the lungs every 6 (six) hours as needed for wheezing or shortness of breath.    Marland Kitchen aspirin EC 81 MG tablet Take 81 mg by mouth Cruz.    Marland Kitchen atorvastatin (LIPITOR) 80 MG tablet Take 80 mg by mouth Cruz.    . carboxymethylcellulose 1 % ophthalmic solution Apply 1 drop to eye 3 (three) times Cruz.    . Cholecalciferol (VITAMIN D3) 10000 units TABS Take 1 tablet by mouth Cruz.    . DULoxetine (CYMBALTA) 30 MG capsule Take 30 mg by mouth Cruz.    . hydrocortisone 2.5 % lotion Apply 1 application topically 2 (two) times Cruz.    Marland Kitchen ketoconazole (NIZORAL) 2 % shampoo Apply 1 application topically 2 (two) times a week.    Marland Kitchen lisinopril (PRINIVIL,ZESTRIL) 2.5 MG tablet Take 2.5 mg by mouth Cruz.    . magnesium  oxide (MAG-OX) 400 MG tablet Take 400 mg by mouth 2 (two) times Cruz.    . metoprolol tartrate (LOPRESSOR) 25 MG tablet Take 25 mg by mouth 2 (two) times Cruz.    . Multiple Vitamins-Minerals (PRESERVISION AREDS PO) Take 1 capsule by mouth 2 (two) times Cruz.    . mycophenolate (CELLCEPT) 250 MG capsule Take 250 mg by mouth 2 (two) times Cruz.    . nitroGLYCERIN (NITROSTAT) 0.4 MG SL tablet Place 0.4 mg under the tongue every 5 (five) minutes as needed for chest pain.    .  predniSONE (DELTASONE) 5 MG tablet Take 7.5 mg by mouth Cruz with breakfast.    . tacrolimus (PROGRAF) 1 MG capsule Take 3 mg by mouth 2 (two) times Cruz.     No current facility-administered medications for this visit.      Allergies:   Codeine; Grapefruit concentrate; Morphine and related; and Valium [diazepam]   Social History:  The patient  reports that he has quit smoking. he has never used smokeless tobacco. He reports that he drinks alcohol. He reports that he does not use drugs.   Family History:   family history includes CAD in his mother and paternal grandfather; Dementia in his mother.    Review of Systems: Review of Systems  Constitutional: Negative.   Respiratory: Negative.   Cardiovascular: Negative.   Gastrointestinal: Negative.   Musculoskeletal: Negative.        Fall risk, leg weakness, leg pain  Neurological: Negative.   Psychiatric/Behavioral: Negative.   All other systems reviewed and are negative.    PHYSICAL EXAM: VS:  BP 116/64 (BP Location: Right Arm, Patient Position: Sitting, Cuff Size: Normal)   Pulse 65   Ht 5\' 3"  (1.6 m)   Wt 131 lb (59.4 kg)   BMI 23.21 kg/m  , BMI Body mass index is 23.21 kg/m. GEN: Well nourished, well developed, in no acute distress , presenting a wheelchair HEENT: normal  Neck: no JVD, carotid bruits, or masses Cardiac: RRR; no murmurs, rubs, or gallops,no edema  Respiratory:  clear to auscultation bilaterally, normal work of breathing GI: soft, nontender, nondistended, + BS MS: no deformity or atrophy  Skin: warm and dry, no rash Neuro:  Strength and sensation are intact Psych: euthymic mood, full affect    Recent Labs: 04/17/2017: BUN 17; Creatinine, Ser 0.90; Hemoglobin 13.5; Platelets 205; Potassium 4.1; Sodium 136    Lipid Panel Lab Results  Component Value Date   CHOL 119 04/17/2017   HDL 60 04/17/2017   LDLCALC 50 04/17/2017   TRIG 47 04/17/2017      Wt Readings from Last 3 Encounters:  04/25/17  131 lb (59.4 kg)  04/18/17 131 lb (59.4 kg)       ASSESSMENT AND PLAN:  Coronary artery disease of native artery of native heart with stable angina pectoris ([place]) - Plan: EKG 12-Lead Recent cardiac catheterization with nonobstructive disease No further workup at this time For any further episodes of chest pain, would treat medically Recommended he try a GI cocktail will including Tums, Pepcid, carbonated sodas  Controlled type 1 diabetes mellitus with other circulatory complication ([place]) Managed by primary care physician   ESRD on dialysis [facility]) Underwent pancreatic and kidney transplant 2004 Diabetes improved  Chest pain, unspecified type Prior history of atypical chest pain Recent cardiac catheterization, nonobstructive disease  Hyperlipidemia Will continue Lipitor Discussed possible side effects including myalgias   Hx of peripheral neuropathy Unable to ambulate very far, presenting in a wheelchair  Reports leg weakness, legs give out  Disposition:   F/U  12 months    Total encounter time more than 45 minutes  Greater than 50% was spent in counseling and coordination of care with the patient    Orders Placed This Encounter  Procedures  . EKG 12-Lead     Signed, Esmond Plants, M.D., Ph.D. 04/25/2017  [place], [place]

## 2017-04-25 ENCOUNTER — Encounter: Payer: Self-pay | Admitting: Cardiovascular Disease

## 2017-04-25 ENCOUNTER — Ambulatory Visit (INDEPENDENT_AMBULATORY_CARE_PROVIDER_SITE_OTHER): Payer: Medicare HMO | Admitting: Cardiovascular Disease

## 2017-04-25 VITALS — BP 116/64 | HR 65 | Ht 63.0 in | Wt 131.0 lb

## 2017-04-25 DIAGNOSIS — I25118 Atherosclerotic heart disease of native coronary artery with other forms of angina pectoris: Secondary | ICD-10-CM

## 2017-04-25 DIAGNOSIS — Z8669 Personal history of other diseases of the nervous system and sense organs: Secondary | ICD-10-CM

## 2017-04-25 DIAGNOSIS — E1059 Type 1 diabetes mellitus with other circulatory complications: Secondary | ICD-10-CM | POA: Diagnosis not present

## 2017-04-25 DIAGNOSIS — N186 End stage renal disease: Secondary | ICD-10-CM | POA: Diagnosis not present

## 2017-04-25 DIAGNOSIS — R079 Chest pain, unspecified: Secondary | ICD-10-CM | POA: Diagnosis not present

## 2017-04-25 DIAGNOSIS — Z992 Dependence on renal dialysis: Secondary | ICD-10-CM | POA: Diagnosis not present

## 2017-04-25 NOTE — Patient Instructions (Signed)

## 2017-06-17 DIAGNOSIS — N63 Unspecified lump in unspecified breast: Secondary | ICD-10-CM | POA: Diagnosis not present

## 2017-06-17 DIAGNOSIS — I471 Supraventricular tachycardia: Secondary | ICD-10-CM | POA: Diagnosis not present

## 2017-06-17 DIAGNOSIS — D72829 Elevated white blood cell count, unspecified: Secondary | ICD-10-CM | POA: Diagnosis not present

## 2017-06-17 DIAGNOSIS — N649 Disorder of breast, unspecified: Secondary | ICD-10-CM | POA: Diagnosis not present

## 2017-06-17 DIAGNOSIS — R079 Chest pain, unspecified: Secondary | ICD-10-CM | POA: Diagnosis not present

## 2017-06-17 DIAGNOSIS — N6489 Other specified disorders of breast: Secondary | ICD-10-CM | POA: Diagnosis not present

## 2017-06-17 DIAGNOSIS — K219 Gastro-esophageal reflux disease without esophagitis: Secondary | ICD-10-CM | POA: Diagnosis not present

## 2017-06-17 DIAGNOSIS — Z940 Kidney transplant status: Secondary | ICD-10-CM | POA: Diagnosis not present

## 2017-06-17 DIAGNOSIS — Z8673 Personal history of transient ischemic attack (TIA), and cerebral infarction without residual deficits: Secondary | ICD-10-CM | POA: Diagnosis not present

## 2017-06-17 DIAGNOSIS — I10 Essential (primary) hypertension: Secondary | ICD-10-CM | POA: Diagnosis not present

## 2017-06-17 DIAGNOSIS — R0789 Other chest pain: Secondary | ICD-10-CM | POA: Diagnosis not present

## 2017-06-17 DIAGNOSIS — Z9483 Pancreas transplant status: Secondary | ICD-10-CM | POA: Diagnosis not present

## 2017-06-17 DIAGNOSIS — E119 Type 2 diabetes mellitus without complications: Secondary | ICD-10-CM | POA: Diagnosis not present

## 2017-06-17 DIAGNOSIS — I456 Pre-excitation syndrome: Secondary | ICD-10-CM | POA: Diagnosis not present

## 2017-06-17 DIAGNOSIS — N261 Atrophy of kidney (terminal): Secondary | ICD-10-CM | POA: Diagnosis not present

## 2017-06-17 DIAGNOSIS — M810 Age-related osteoporosis without current pathological fracture: Secondary | ICD-10-CM | POA: Diagnosis not present

## 2017-06-17 DIAGNOSIS — K222 Esophageal obstruction: Secondary | ICD-10-CM | POA: Diagnosis not present

## 2017-06-17 DIAGNOSIS — I2510 Atherosclerotic heart disease of native coronary artery without angina pectoris: Secondary | ICD-10-CM | POA: Diagnosis not present

## 2017-06-17 DIAGNOSIS — R569 Unspecified convulsions: Secondary | ICD-10-CM | POA: Diagnosis not present

## 2017-06-18 DIAGNOSIS — R0789 Other chest pain: Secondary | ICD-10-CM | POA: Diagnosis not present

## 2017-06-18 DIAGNOSIS — N649 Disorder of breast, unspecified: Secondary | ICD-10-CM | POA: Diagnosis not present

## 2017-06-18 DIAGNOSIS — N261 Atrophy of kidney (terminal): Secondary | ICD-10-CM | POA: Diagnosis not present

## 2017-06-19 DIAGNOSIS — Z885 Allergy status to narcotic agent status: Secondary | ICD-10-CM | POA: Diagnosis not present

## 2017-06-19 DIAGNOSIS — I10 Essential (primary) hypertension: Secondary | ICD-10-CM | POA: Diagnosis not present

## 2017-06-19 DIAGNOSIS — Z8673 Personal history of transient ischemic attack (TIA), and cerebral infarction without residual deficits: Secondary | ICD-10-CM | POA: Diagnosis not present

## 2017-06-19 DIAGNOSIS — Z940 Kidney transplant status: Secondary | ICD-10-CM | POA: Diagnosis not present

## 2017-06-19 DIAGNOSIS — G40909 Epilepsy, unspecified, not intractable, without status epilepticus: Secondary | ICD-10-CM | POA: Diagnosis not present

## 2017-06-19 DIAGNOSIS — W19XXXA Unspecified fall, initial encounter: Secondary | ICD-10-CM | POA: Diagnosis not present

## 2017-06-19 DIAGNOSIS — E1122 Type 2 diabetes mellitus with diabetic chronic kidney disease: Secondary | ICD-10-CM | POA: Diagnosis not present

## 2017-06-19 DIAGNOSIS — S060X0A Concussion without loss of consciousness, initial encounter: Secondary | ICD-10-CM | POA: Diagnosis not present

## 2017-06-19 DIAGNOSIS — R569 Unspecified convulsions: Secondary | ICD-10-CM | POA: Diagnosis not present

## 2017-06-19 DIAGNOSIS — Z992 Dependence on renal dialysis: Secondary | ICD-10-CM | POA: Diagnosis not present

## 2017-06-19 DIAGNOSIS — W228XXA Striking against or struck by other objects, initial encounter: Secondary | ICD-10-CM | POA: Diagnosis not present

## 2017-06-19 DIAGNOSIS — Z9483 Pancreas transplant status: Secondary | ICD-10-CM | POA: Diagnosis not present

## 2017-06-19 DIAGNOSIS — N186 End stage renal disease: Secondary | ICD-10-CM | POA: Diagnosis not present

## 2017-06-19 DIAGNOSIS — I120 Hypertensive chronic kidney disease with stage 5 chronic kidney disease or end stage renal disease: Secondary | ICD-10-CM | POA: Diagnosis not present

## 2017-09-26 DIAGNOSIS — R531 Weakness: Secondary | ICD-10-CM | POA: Diagnosis not present

## 2017-09-26 DIAGNOSIS — R0789 Other chest pain: Secondary | ICD-10-CM | POA: Diagnosis not present

## 2017-09-26 DIAGNOSIS — Z87891 Personal history of nicotine dependence: Secondary | ICD-10-CM | POA: Diagnosis not present

## 2017-09-26 DIAGNOSIS — E119 Type 2 diabetes mellitus without complications: Secondary | ICD-10-CM | POA: Diagnosis not present

## 2017-09-26 DIAGNOSIS — R51 Headache: Secondary | ICD-10-CM | POA: Diagnosis not present

## 2017-09-26 DIAGNOSIS — Z9483 Pancreas transplant status: Secondary | ICD-10-CM | POA: Diagnosis not present

## 2017-09-26 DIAGNOSIS — R079 Chest pain, unspecified: Secondary | ICD-10-CM | POA: Diagnosis not present

## 2017-09-26 DIAGNOSIS — M542 Cervicalgia: Secondary | ICD-10-CM | POA: Diagnosis not present

## 2017-09-26 DIAGNOSIS — H539 Unspecified visual disturbance: Secondary | ICD-10-CM | POA: Diagnosis not present

## 2017-09-26 DIAGNOSIS — I10 Essential (primary) hypertension: Secondary | ICD-10-CM | POA: Diagnosis not present

## 2017-09-26 DIAGNOSIS — Z885 Allergy status to narcotic agent status: Secondary | ICD-10-CM | POA: Diagnosis not present

## 2017-09-26 DIAGNOSIS — Z940 Kidney transplant status: Secondary | ICD-10-CM | POA: Diagnosis not present

## 2018-04-02 DIAGNOSIS — H543 Unqualified visual loss, both eyes: Secondary | ICD-10-CM | POA: Diagnosis not present

## 2018-04-02 DIAGNOSIS — E119 Type 2 diabetes mellitus without complications: Secondary | ICD-10-CM | POA: Diagnosis not present

## 2018-04-02 DIAGNOSIS — R0789 Other chest pain: Secondary | ICD-10-CM | POA: Diagnosis not present

## 2018-04-02 DIAGNOSIS — R531 Weakness: Secondary | ICD-10-CM | POA: Diagnosis not present

## 2018-04-02 DIAGNOSIS — E11319 Type 2 diabetes mellitus with unspecified diabetic retinopathy without macular edema: Secondary | ICD-10-CM | POA: Diagnosis not present

## 2018-04-02 DIAGNOSIS — H547 Unspecified visual loss: Secondary | ICD-10-CM | POA: Diagnosis not present

## 2018-04-02 DIAGNOSIS — Z8679 Personal history of other diseases of the circulatory system: Secondary | ICD-10-CM | POA: Diagnosis not present

## 2018-04-02 DIAGNOSIS — R29818 Other symptoms and signs involving the nervous system: Secondary | ICD-10-CM | POA: Diagnosis not present

## 2018-04-02 DIAGNOSIS — R402363 Coma scale, best motor response, obeys commands, at hospital admission: Secondary | ICD-10-CM | POA: Diagnosis not present

## 2018-04-02 DIAGNOSIS — Z9483 Pancreas transplant status: Secondary | ICD-10-CM | POA: Diagnosis not present

## 2018-04-02 DIAGNOSIS — R29810 Facial weakness: Secondary | ICD-10-CM | POA: Diagnosis not present

## 2018-04-02 DIAGNOSIS — G8194 Hemiplegia, unspecified affecting left nondominant side: Secondary | ICD-10-CM | POA: Diagnosis not present

## 2018-04-02 DIAGNOSIS — Z823 Family history of stroke: Secondary | ICD-10-CM | POA: Diagnosis not present

## 2018-04-02 DIAGNOSIS — R29707 NIHSS score 7: Secondary | ICD-10-CM | POA: Diagnosis not present

## 2018-04-02 DIAGNOSIS — R778 Other specified abnormalities of plasma proteins: Secondary | ICD-10-CM | POA: Diagnosis not present

## 2018-04-02 DIAGNOSIS — I10 Essential (primary) hypertension: Secondary | ICD-10-CM | POA: Diagnosis not present

## 2018-04-02 DIAGNOSIS — Z9889 Other specified postprocedural states: Secondary | ICD-10-CM | POA: Diagnosis not present

## 2018-04-02 DIAGNOSIS — I639 Cerebral infarction, unspecified: Secondary | ICD-10-CM | POA: Diagnosis not present

## 2018-04-02 DIAGNOSIS — R402133 Coma scale, eyes open, to sound, at hospital admission: Secondary | ICD-10-CM | POA: Diagnosis not present

## 2018-04-02 DIAGNOSIS — I69354 Hemiplegia and hemiparesis following cerebral infarction affecting left non-dominant side: Secondary | ICD-10-CM | POA: Diagnosis not present

## 2018-04-02 DIAGNOSIS — Z940 Kidney transplant status: Secondary | ICD-10-CM | POA: Diagnosis not present

## 2018-04-02 DIAGNOSIS — R569 Unspecified convulsions: Secondary | ICD-10-CM | POA: Diagnosis not present

## 2018-04-02 DIAGNOSIS — I517 Cardiomegaly: Secondary | ICD-10-CM | POA: Diagnosis not present

## 2018-04-02 DIAGNOSIS — R1319 Other dysphagia: Secondary | ICD-10-CM | POA: Diagnosis not present

## 2018-04-02 DIAGNOSIS — I69322 Dysarthria following cerebral infarction: Secondary | ICD-10-CM | POA: Diagnosis not present

## 2018-04-02 DIAGNOSIS — I480 Paroxysmal atrial fibrillation: Secondary | ICD-10-CM | POA: Diagnosis not present

## 2018-04-02 DIAGNOSIS — I6389 Other cerebral infarction: Secondary | ICD-10-CM | POA: Diagnosis not present

## 2018-04-02 DIAGNOSIS — R079 Chest pain, unspecified: Secondary | ICD-10-CM | POA: Diagnosis not present

## 2018-04-02 DIAGNOSIS — R402253 Coma scale, best verbal response, oriented, at hospital admission: Secondary | ICD-10-CM | POA: Diagnosis not present

## 2018-04-02 DIAGNOSIS — Z9282 Status post administration of tPA (rtPA) in a different facility within the last 24 hours prior to admission to current facility: Secondary | ICD-10-CM | POA: Diagnosis not present

## 2018-05-25 DIAGNOSIS — R29898 Other symptoms and signs involving the musculoskeletal system: Secondary | ICD-10-CM | POA: Diagnosis not present

## 2018-05-25 DIAGNOSIS — I10 Essential (primary) hypertension: Secondary | ICD-10-CM | POA: Diagnosis not present

## 2018-05-25 DIAGNOSIS — Z9483 Pancreas transplant status: Secondary | ICD-10-CM | POA: Diagnosis not present

## 2018-05-25 DIAGNOSIS — E119 Type 2 diabetes mellitus without complications: Secondary | ICD-10-CM | POA: Diagnosis not present

## 2018-05-25 DIAGNOSIS — I456 Pre-excitation syndrome: Secondary | ICD-10-CM | POA: Diagnosis not present

## 2018-05-25 DIAGNOSIS — R609 Edema, unspecified: Secondary | ICD-10-CM | POA: Diagnosis not present

## 2018-05-25 DIAGNOSIS — Z940 Kidney transplant status: Secondary | ICD-10-CM | POA: Diagnosis not present

## 2018-05-25 DIAGNOSIS — Z886 Allergy status to analgesic agent status: Secondary | ICD-10-CM | POA: Diagnosis not present

## 2018-05-25 DIAGNOSIS — G459 Transient cerebral ischemic attack, unspecified: Secondary | ICD-10-CM | POA: Diagnosis not present

## 2018-05-25 DIAGNOSIS — R471 Dysarthria and anarthria: Secondary | ICD-10-CM | POA: Diagnosis not present

## 2018-05-25 DIAGNOSIS — R29818 Other symptoms and signs involving the nervous system: Secondary | ICD-10-CM | POA: Diagnosis not present

## 2018-05-25 DIAGNOSIS — R51 Headache: Secondary | ICD-10-CM | POA: Diagnosis not present

## 2018-05-25 DIAGNOSIS — Z7951 Long term (current) use of inhaled steroids: Secondary | ICD-10-CM | POA: Diagnosis not present

## 2018-05-25 DIAGNOSIS — Z87891 Personal history of nicotine dependence: Secondary | ICD-10-CM | POA: Diagnosis not present

## 2018-05-25 DIAGNOSIS — R4781 Slurred speech: Secondary | ICD-10-CM | POA: Diagnosis not present

## 2018-06-11 DIAGNOSIS — M79671 Pain in right foot: Secondary | ICD-10-CM | POA: Diagnosis not present

## 2018-06-11 DIAGNOSIS — M79604 Pain in right leg: Secondary | ICD-10-CM | POA: Diagnosis not present

## 2018-06-11 DIAGNOSIS — M79672 Pain in left foot: Secondary | ICD-10-CM | POA: Diagnosis not present

## 2018-06-11 DIAGNOSIS — M79605 Pain in left leg: Secondary | ICD-10-CM | POA: Diagnosis not present

## 2018-06-11 DIAGNOSIS — M545 Low back pain: Secondary | ICD-10-CM | POA: Diagnosis not present

## 2018-06-11 DIAGNOSIS — R278 Other lack of coordination: Secondary | ICD-10-CM | POA: Diagnosis not present

## 2018-06-11 DIAGNOSIS — R202 Paresthesia of skin: Secondary | ICD-10-CM | POA: Diagnosis not present

## 2018-06-11 DIAGNOSIS — G609 Hereditary and idiopathic neuropathy, unspecified: Secondary | ICD-10-CM | POA: Diagnosis not present

## 2018-06-13 DIAGNOSIS — R278 Other lack of coordination: Secondary | ICD-10-CM | POA: Diagnosis not present

## 2018-06-13 DIAGNOSIS — R202 Paresthesia of skin: Secondary | ICD-10-CM | POA: Diagnosis not present

## 2018-06-13 DIAGNOSIS — M79605 Pain in left leg: Secondary | ICD-10-CM | POA: Diagnosis not present

## 2018-06-13 DIAGNOSIS — G609 Hereditary and idiopathic neuropathy, unspecified: Secondary | ICD-10-CM | POA: Diagnosis not present

## 2018-06-13 DIAGNOSIS — M545 Low back pain: Secondary | ICD-10-CM | POA: Diagnosis not present

## 2018-06-13 DIAGNOSIS — M79671 Pain in right foot: Secondary | ICD-10-CM | POA: Diagnosis not present

## 2018-06-13 DIAGNOSIS — M79672 Pain in left foot: Secondary | ICD-10-CM | POA: Diagnosis not present

## 2018-06-13 DIAGNOSIS — M79604 Pain in right leg: Secondary | ICD-10-CM | POA: Diagnosis not present

## 2018-06-15 DIAGNOSIS — R471 Dysarthria and anarthria: Secondary | ICD-10-CM | POA: Diagnosis not present

## 2018-06-15 DIAGNOSIS — Z8673 Personal history of transient ischemic attack (TIA), and cerebral infarction without residual deficits: Secondary | ICD-10-CM | POA: Diagnosis not present

## 2018-06-15 DIAGNOSIS — I6782 Cerebral ischemia: Secondary | ICD-10-CM | POA: Diagnosis not present

## 2018-06-15 DIAGNOSIS — Z91018 Allergy to other foods: Secondary | ICD-10-CM | POA: Diagnosis not present

## 2018-06-15 DIAGNOSIS — E119 Type 2 diabetes mellitus without complications: Secondary | ICD-10-CM | POA: Diagnosis not present

## 2018-06-15 DIAGNOSIS — I10 Essential (primary) hypertension: Secondary | ICD-10-CM | POA: Diagnosis not present

## 2018-06-15 DIAGNOSIS — Z79899 Other long term (current) drug therapy: Secondary | ICD-10-CM | POA: Diagnosis not present

## 2018-06-15 DIAGNOSIS — R0789 Other chest pain: Secondary | ICD-10-CM | POA: Diagnosis not present

## 2018-06-15 DIAGNOSIS — R4781 Slurred speech: Secondary | ICD-10-CM | POA: Diagnosis not present

## 2018-06-15 DIAGNOSIS — R531 Weakness: Secondary | ICD-10-CM | POA: Diagnosis not present

## 2018-06-15 DIAGNOSIS — Z791 Long term (current) use of non-steroidal anti-inflammatories (NSAID): Secondary | ICD-10-CM | POA: Diagnosis not present

## 2018-06-15 DIAGNOSIS — Z888 Allergy status to other drugs, medicaments and biological substances status: Secondary | ICD-10-CM | POA: Diagnosis not present

## 2018-06-15 DIAGNOSIS — R29818 Other symptoms and signs involving the nervous system: Secondary | ICD-10-CM | POA: Diagnosis not present

## 2018-06-15 DIAGNOSIS — G8190 Hemiplegia, unspecified affecting unspecified side: Secondary | ICD-10-CM | POA: Diagnosis not present

## 2018-06-15 DIAGNOSIS — Z885 Allergy status to narcotic agent status: Secondary | ICD-10-CM | POA: Diagnosis not present

## 2018-06-15 DIAGNOSIS — R29810 Facial weakness: Secondary | ICD-10-CM | POA: Diagnosis not present

## 2018-06-15 DIAGNOSIS — R5383 Other fatigue: Secondary | ICD-10-CM | POA: Diagnosis not present

## 2018-06-15 DIAGNOSIS — R202 Paresthesia of skin: Secondary | ICD-10-CM | POA: Diagnosis not present

## 2018-06-15 DIAGNOSIS — R079 Chest pain, unspecified: Secondary | ICD-10-CM | POA: Diagnosis not present

## 2018-06-17 DIAGNOSIS — M79671 Pain in right foot: Secondary | ICD-10-CM | POA: Diagnosis not present

## 2018-06-17 DIAGNOSIS — R278 Other lack of coordination: Secondary | ICD-10-CM | POA: Diagnosis not present

## 2018-06-17 DIAGNOSIS — R202 Paresthesia of skin: Secondary | ICD-10-CM | POA: Diagnosis not present

## 2018-06-17 DIAGNOSIS — M79604 Pain in right leg: Secondary | ICD-10-CM | POA: Diagnosis not present

## 2018-06-17 DIAGNOSIS — M545 Low back pain: Secondary | ICD-10-CM | POA: Diagnosis not present

## 2018-06-17 DIAGNOSIS — M79605 Pain in left leg: Secondary | ICD-10-CM | POA: Diagnosis not present

## 2018-06-17 DIAGNOSIS — G609 Hereditary and idiopathic neuropathy, unspecified: Secondary | ICD-10-CM | POA: Diagnosis not present

## 2018-06-17 DIAGNOSIS — M79672 Pain in left foot: Secondary | ICD-10-CM | POA: Diagnosis not present

## 2018-06-18 DIAGNOSIS — M545 Low back pain: Secondary | ICD-10-CM | POA: Diagnosis not present

## 2018-06-18 DIAGNOSIS — M79604 Pain in right leg: Secondary | ICD-10-CM | POA: Diagnosis not present

## 2018-06-18 DIAGNOSIS — M79671 Pain in right foot: Secondary | ICD-10-CM | POA: Diagnosis not present

## 2018-06-18 DIAGNOSIS — R202 Paresthesia of skin: Secondary | ICD-10-CM | POA: Diagnosis not present

## 2018-06-18 DIAGNOSIS — G609 Hereditary and idiopathic neuropathy, unspecified: Secondary | ICD-10-CM | POA: Diagnosis not present

## 2018-06-18 DIAGNOSIS — M79605 Pain in left leg: Secondary | ICD-10-CM | POA: Diagnosis not present

## 2018-06-18 DIAGNOSIS — R278 Other lack of coordination: Secondary | ICD-10-CM | POA: Diagnosis not present

## 2018-06-18 DIAGNOSIS — M79672 Pain in left foot: Secondary | ICD-10-CM | POA: Diagnosis not present

## 2018-06-21 DIAGNOSIS — M79672 Pain in left foot: Secondary | ICD-10-CM | POA: Diagnosis not present

## 2018-06-21 DIAGNOSIS — R202 Paresthesia of skin: Secondary | ICD-10-CM | POA: Diagnosis not present

## 2018-06-21 DIAGNOSIS — M79671 Pain in right foot: Secondary | ICD-10-CM | POA: Diagnosis not present

## 2018-06-21 DIAGNOSIS — M79604 Pain in right leg: Secondary | ICD-10-CM | POA: Diagnosis not present

## 2018-06-21 DIAGNOSIS — M79605 Pain in left leg: Secondary | ICD-10-CM | POA: Diagnosis not present

## 2018-06-21 DIAGNOSIS — M545 Low back pain: Secondary | ICD-10-CM | POA: Diagnosis not present

## 2018-06-21 DIAGNOSIS — G609 Hereditary and idiopathic neuropathy, unspecified: Secondary | ICD-10-CM | POA: Diagnosis not present

## 2018-06-21 DIAGNOSIS — R278 Other lack of coordination: Secondary | ICD-10-CM | POA: Diagnosis not present

## 2018-06-24 DIAGNOSIS — M79671 Pain in right foot: Secondary | ICD-10-CM | POA: Diagnosis not present

## 2018-06-24 DIAGNOSIS — G609 Hereditary and idiopathic neuropathy, unspecified: Secondary | ICD-10-CM | POA: Diagnosis not present

## 2018-06-24 DIAGNOSIS — M79672 Pain in left foot: Secondary | ICD-10-CM | POA: Diagnosis not present

## 2018-06-24 DIAGNOSIS — R278 Other lack of coordination: Secondary | ICD-10-CM | POA: Diagnosis not present

## 2018-06-24 DIAGNOSIS — R202 Paresthesia of skin: Secondary | ICD-10-CM | POA: Diagnosis not present

## 2018-06-24 DIAGNOSIS — M79604 Pain in right leg: Secondary | ICD-10-CM | POA: Diagnosis not present

## 2018-06-24 DIAGNOSIS — M79605 Pain in left leg: Secondary | ICD-10-CM | POA: Diagnosis not present

## 2018-06-24 DIAGNOSIS — M545 Low back pain: Secondary | ICD-10-CM | POA: Diagnosis not present

## 2018-06-26 DIAGNOSIS — G609 Hereditary and idiopathic neuropathy, unspecified: Secondary | ICD-10-CM | POA: Diagnosis not present

## 2018-06-26 DIAGNOSIS — R202 Paresthesia of skin: Secondary | ICD-10-CM | POA: Diagnosis not present

## 2018-06-26 DIAGNOSIS — M545 Low back pain: Secondary | ICD-10-CM | POA: Diagnosis not present

## 2018-06-26 DIAGNOSIS — M79672 Pain in left foot: Secondary | ICD-10-CM | POA: Diagnosis not present

## 2018-06-26 DIAGNOSIS — M79604 Pain in right leg: Secondary | ICD-10-CM | POA: Diagnosis not present

## 2018-06-26 DIAGNOSIS — M79671 Pain in right foot: Secondary | ICD-10-CM | POA: Diagnosis not present

## 2018-06-26 DIAGNOSIS — M79605 Pain in left leg: Secondary | ICD-10-CM | POA: Diagnosis not present

## 2018-06-26 DIAGNOSIS — R278 Other lack of coordination: Secondary | ICD-10-CM | POA: Diagnosis not present

## 2018-06-28 DIAGNOSIS — M79672 Pain in left foot: Secondary | ICD-10-CM | POA: Diagnosis not present

## 2018-06-28 DIAGNOSIS — R202 Paresthesia of skin: Secondary | ICD-10-CM | POA: Diagnosis not present

## 2018-06-28 DIAGNOSIS — G609 Hereditary and idiopathic neuropathy, unspecified: Secondary | ICD-10-CM | POA: Diagnosis not present

## 2018-06-28 DIAGNOSIS — M545 Low back pain: Secondary | ICD-10-CM | POA: Diagnosis not present

## 2018-06-28 DIAGNOSIS — M79605 Pain in left leg: Secondary | ICD-10-CM | POA: Diagnosis not present

## 2018-06-28 DIAGNOSIS — M79671 Pain in right foot: Secondary | ICD-10-CM | POA: Diagnosis not present

## 2018-06-28 DIAGNOSIS — R278 Other lack of coordination: Secondary | ICD-10-CM | POA: Diagnosis not present

## 2018-06-28 DIAGNOSIS — M79604 Pain in right leg: Secondary | ICD-10-CM | POA: Diagnosis not present

## 2018-07-01 DIAGNOSIS — M79605 Pain in left leg: Secondary | ICD-10-CM | POA: Diagnosis not present

## 2018-07-01 DIAGNOSIS — G609 Hereditary and idiopathic neuropathy, unspecified: Secondary | ICD-10-CM | POA: Diagnosis not present

## 2018-07-01 DIAGNOSIS — M79604 Pain in right leg: Secondary | ICD-10-CM | POA: Diagnosis not present

## 2018-07-01 DIAGNOSIS — M79671 Pain in right foot: Secondary | ICD-10-CM | POA: Diagnosis not present

## 2018-07-01 DIAGNOSIS — M79672 Pain in left foot: Secondary | ICD-10-CM | POA: Diagnosis not present

## 2018-07-01 DIAGNOSIS — R278 Other lack of coordination: Secondary | ICD-10-CM | POA: Diagnosis not present

## 2018-07-01 DIAGNOSIS — R202 Paresthesia of skin: Secondary | ICD-10-CM | POA: Diagnosis not present

## 2018-07-01 DIAGNOSIS — M545 Low back pain: Secondary | ICD-10-CM | POA: Diagnosis not present

## 2018-07-03 DIAGNOSIS — M79672 Pain in left foot: Secondary | ICD-10-CM | POA: Diagnosis not present

## 2018-07-03 DIAGNOSIS — R278 Other lack of coordination: Secondary | ICD-10-CM | POA: Diagnosis not present

## 2018-07-03 DIAGNOSIS — M545 Low back pain: Secondary | ICD-10-CM | POA: Diagnosis not present

## 2018-07-03 DIAGNOSIS — M79671 Pain in right foot: Secondary | ICD-10-CM | POA: Diagnosis not present

## 2018-07-03 DIAGNOSIS — R202 Paresthesia of skin: Secondary | ICD-10-CM | POA: Diagnosis not present

## 2018-07-03 DIAGNOSIS — G609 Hereditary and idiopathic neuropathy, unspecified: Secondary | ICD-10-CM | POA: Diagnosis not present

## 2018-07-03 DIAGNOSIS — M79604 Pain in right leg: Secondary | ICD-10-CM | POA: Diagnosis not present

## 2018-07-03 DIAGNOSIS — M79605 Pain in left leg: Secondary | ICD-10-CM | POA: Diagnosis not present

## 2018-07-05 DIAGNOSIS — G609 Hereditary and idiopathic neuropathy, unspecified: Secondary | ICD-10-CM | POA: Diagnosis not present

## 2018-07-05 DIAGNOSIS — M79671 Pain in right foot: Secondary | ICD-10-CM | POA: Diagnosis not present

## 2018-07-05 DIAGNOSIS — M545 Low back pain: Secondary | ICD-10-CM | POA: Diagnosis not present

## 2018-07-05 DIAGNOSIS — R202 Paresthesia of skin: Secondary | ICD-10-CM | POA: Diagnosis not present

## 2018-07-05 DIAGNOSIS — M79605 Pain in left leg: Secondary | ICD-10-CM | POA: Diagnosis not present

## 2018-07-05 DIAGNOSIS — M79672 Pain in left foot: Secondary | ICD-10-CM | POA: Diagnosis not present

## 2018-07-05 DIAGNOSIS — M79604 Pain in right leg: Secondary | ICD-10-CM | POA: Diagnosis not present

## 2018-07-05 DIAGNOSIS — R278 Other lack of coordination: Secondary | ICD-10-CM | POA: Diagnosis not present

## 2018-12-04 DIAGNOSIS — E119 Type 2 diabetes mellitus without complications: Secondary | ICD-10-CM | POA: Diagnosis not present

## 2018-12-04 DIAGNOSIS — I10 Essential (primary) hypertension: Secondary | ICD-10-CM | POA: Diagnosis not present

## 2018-12-04 DIAGNOSIS — K449 Diaphragmatic hernia without obstruction or gangrene: Secondary | ICD-10-CM | POA: Diagnosis not present

## 2018-12-04 DIAGNOSIS — Z9483 Pancreas transplant status: Secondary | ICD-10-CM | POA: Diagnosis not present

## 2018-12-04 DIAGNOSIS — M79602 Pain in left arm: Secondary | ICD-10-CM | POA: Diagnosis not present

## 2018-12-04 DIAGNOSIS — Z8673 Personal history of transient ischemic attack (TIA), and cerebral infarction without residual deficits: Secondary | ICD-10-CM | POA: Diagnosis not present

## 2018-12-04 DIAGNOSIS — R9431 Abnormal electrocardiogram [ECG] [EKG]: Secondary | ICD-10-CM | POA: Diagnosis not present

## 2018-12-04 DIAGNOSIS — R0789 Other chest pain: Secondary | ICD-10-CM | POA: Diagnosis not present

## 2018-12-04 DIAGNOSIS — J9811 Atelectasis: Secondary | ICD-10-CM | POA: Diagnosis not present

## 2018-12-04 DIAGNOSIS — M1991 Primary osteoarthritis, unspecified site: Secondary | ICD-10-CM | POA: Diagnosis not present

## 2018-12-04 DIAGNOSIS — Z940 Kidney transplant status: Secondary | ICD-10-CM | POA: Diagnosis not present

## 2018-12-04 DIAGNOSIS — R079 Chest pain, unspecified: Secondary | ICD-10-CM | POA: Diagnosis not present

## 2018-12-04 DIAGNOSIS — R918 Other nonspecific abnormal finding of lung field: Secondary | ICD-10-CM | POA: Diagnosis not present

## 2018-12-04 DIAGNOSIS — Z049 Encounter for examination and observation for unspecified reason: Secondary | ICD-10-CM | POA: Diagnosis not present

## 2018-12-04 DIAGNOSIS — Z87891 Personal history of nicotine dependence: Secondary | ICD-10-CM | POA: Diagnosis not present

## 2018-12-26 DIAGNOSIS — Z91018 Allergy to other foods: Secondary | ICD-10-CM | POA: Diagnosis not present

## 2018-12-26 DIAGNOSIS — I471 Supraventricular tachycardia: Secondary | ICD-10-CM | POA: Diagnosis not present

## 2018-12-26 DIAGNOSIS — R569 Unspecified convulsions: Secondary | ICD-10-CM | POA: Diagnosis not present

## 2018-12-26 DIAGNOSIS — I6782 Cerebral ischemia: Secondary | ICD-10-CM | POA: Diagnosis not present

## 2018-12-26 DIAGNOSIS — Z8673 Personal history of transient ischemic attack (TIA), and cerebral infarction without residual deficits: Secondary | ICD-10-CM | POA: Diagnosis not present

## 2018-12-26 DIAGNOSIS — Z885 Allergy status to narcotic agent status: Secondary | ICD-10-CM | POA: Diagnosis not present

## 2018-12-26 DIAGNOSIS — R0789 Other chest pain: Secondary | ICD-10-CM | POA: Diagnosis not present

## 2018-12-26 DIAGNOSIS — R079 Chest pain, unspecified: Secondary | ICD-10-CM | POA: Diagnosis not present

## 2018-12-26 DIAGNOSIS — I10 Essential (primary) hypertension: Secondary | ICD-10-CM | POA: Diagnosis not present

## 2018-12-26 DIAGNOSIS — I456 Pre-excitation syndrome: Secondary | ICD-10-CM | POA: Diagnosis not present

## 2018-12-26 DIAGNOSIS — G4089 Other seizures: Secondary | ICD-10-CM | POA: Diagnosis not present

## 2018-12-26 DIAGNOSIS — G5603 Carpal tunnel syndrome, bilateral upper limbs: Secondary | ICD-10-CM | POA: Diagnosis not present

## 2018-12-26 DIAGNOSIS — Z8739 Personal history of other diseases of the musculoskeletal system and connective tissue: Secondary | ICD-10-CM | POA: Diagnosis not present

## 2018-12-26 DIAGNOSIS — J9811 Atelectasis: Secondary | ICD-10-CM | POA: Diagnosis not present

## 2018-12-26 DIAGNOSIS — Z888 Allergy status to other drugs, medicaments and biological substances status: Secondary | ICD-10-CM | POA: Diagnosis not present

## 2018-12-26 DIAGNOSIS — G459 Transient cerebral ischemic attack, unspecified: Secondary | ICD-10-CM | POA: Diagnosis not present

## 2018-12-26 DIAGNOSIS — E119 Type 2 diabetes mellitus without complications: Secondary | ICD-10-CM | POA: Diagnosis not present

## 2018-12-26 DIAGNOSIS — Z87891 Personal history of nicotine dependence: Secondary | ICD-10-CM | POA: Diagnosis not present

## 2018-12-26 DIAGNOSIS — Z7982 Long term (current) use of aspirin: Secondary | ICD-10-CM | POA: Diagnosis not present

## 2018-12-26 DIAGNOSIS — G40909 Epilepsy, unspecified, not intractable, without status epilepticus: Secondary | ICD-10-CM | POA: Diagnosis not present

## 2018-12-26 DIAGNOSIS — R0989 Other specified symptoms and signs involving the circulatory and respiratory systems: Secondary | ICD-10-CM | POA: Diagnosis not present

## 2018-12-26 DIAGNOSIS — Z940 Kidney transplant status: Secondary | ICD-10-CM | POA: Diagnosis not present

## 2019-03-06 DIAGNOSIS — Z20828 Contact with and (suspected) exposure to other viral communicable diseases: Secondary | ICD-10-CM | POA: Diagnosis not present

## 2019-05-27 ENCOUNTER — Emergency Department: Payer: No Typology Code available for payment source

## 2019-05-27 ENCOUNTER — Emergency Department
Admission: EM | Admit: 2019-05-27 | Discharge: 2019-05-27 | Disposition: A | Discharge status: Home / Self Care | Payer: No Typology Code available for payment source | Attending: Emergency Medicine | Admitting: Emergency Medicine

## 2019-05-27 ENCOUNTER — Other Ambulatory Visit: Payer: Self-pay

## 2019-05-27 ENCOUNTER — Encounter: Payer: Self-pay | Admitting: Emergency Medicine

## 2019-05-27 DIAGNOSIS — Z940 Kidney transplant status: Secondary | ICD-10-CM | POA: Diagnosis not present

## 2019-05-27 DIAGNOSIS — I2510 Atherosclerotic heart disease of native coronary artery without angina pectoris: Secondary | ICD-10-CM | POA: Diagnosis not present

## 2019-05-27 DIAGNOSIS — S199XXA Unspecified injury of neck, initial encounter: Secondary | ICD-10-CM | POA: Diagnosis present

## 2019-05-27 DIAGNOSIS — Y92410 Unspecified street and highway as the place of occurrence of the external cause: Secondary | ICD-10-CM | POA: Insufficient documentation

## 2019-05-27 DIAGNOSIS — S39012A Strain of muscle, fascia and tendon of lower back, initial encounter: Secondary | ICD-10-CM | POA: Diagnosis not present

## 2019-05-27 DIAGNOSIS — Z87891 Personal history of nicotine dependence: Secondary | ICD-10-CM | POA: Diagnosis not present

## 2019-05-27 DIAGNOSIS — Y939 Activity, unspecified: Secondary | ICD-10-CM | POA: Insufficient documentation

## 2019-05-27 DIAGNOSIS — N186 End stage renal disease: Secondary | ICD-10-CM | POA: Insufficient documentation

## 2019-05-27 DIAGNOSIS — Z7982 Long term (current) use of aspirin: Secondary | ICD-10-CM | POA: Insufficient documentation

## 2019-05-27 DIAGNOSIS — S161XXA Strain of muscle, fascia and tendon at neck level, initial encounter: Secondary | ICD-10-CM | POA: Diagnosis not present

## 2019-05-27 DIAGNOSIS — Y999 Unspecified external cause status: Secondary | ICD-10-CM | POA: Insufficient documentation

## 2019-05-27 DIAGNOSIS — E1022 Type 1 diabetes mellitus with diabetic chronic kidney disease: Secondary | ICD-10-CM | POA: Insufficient documentation

## 2019-05-27 DIAGNOSIS — Z79899 Other long term (current) drug therapy: Secondary | ICD-10-CM | POA: Diagnosis not present

## 2019-05-27 DIAGNOSIS — M7918 Myalgia, other site: Secondary | ICD-10-CM

## 2019-05-27 NOTE — Discharge Instructions (Signed)
No acute findings on x-ray.  Follow discharge care instruction and continue previous medications.
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
## 2019-05-27 NOTE — ED Notes (Signed)
Pt returned from xray via stretcher.

## 2020-03-18 ENCOUNTER — Telehealth: Payer: Self-pay | Admitting: Cardiovascular Disease

## 2020-03-18 NOTE — Telephone Encounter (Signed)
3 attempts to schedule fu appt from recall list.   Deleting recall.   
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
